# Patient Record
Sex: Female | Born: 1970 | Race: White | Hispanic: No | Marital: Married | Smoking: Former smoker
Health system: Southern US, Community
[De-identification: ages and names within clinical notes are randomized; demographics above are authoritative.]

## PROBLEM LIST (undated history)

## (undated) DIAGNOSIS — I10 Essential (primary) hypertension: Secondary | ICD-10-CM

## (undated) DIAGNOSIS — H547 Unspecified visual loss: Secondary | ICD-10-CM

## (undated) HISTORY — PX: MOUTH SURGERY: SHX715

## (undated) HISTORY — PX: BREAST SURGERY: SHX581

---

## 2001-06-10 ENCOUNTER — Inpatient Hospital Stay (HOSPITAL_COMMUNITY): Admission: AC | Admit: 2001-06-10 | Discharge: 2001-06-11 | Payer: Self-pay | Admitting: *Deleted

## 2005-06-05 ENCOUNTER — Emergency Department (HOSPITAL_COMMUNITY): Admission: EM | Admit: 2005-06-05 | Discharge: 2005-06-05 | Payer: Self-pay | Admitting: Emergency Medicine

## 2006-04-20 ENCOUNTER — Ambulatory Visit: Payer: Self-pay | Admitting: Internal Medicine

## 2006-04-24 ENCOUNTER — Emergency Department (HOSPITAL_COMMUNITY): Admission: EM | Admit: 2006-04-24 | Discharge: 2006-04-24 | Payer: Self-pay | Admitting: Emergency Medicine

## 2006-04-27 ENCOUNTER — Ambulatory Visit: Payer: Self-pay | Admitting: Internal Medicine

## 2006-04-28 ENCOUNTER — Emergency Department (HOSPITAL_COMMUNITY): Admission: EM | Admit: 2006-04-28 | Discharge: 2006-04-28 | Payer: Self-pay | Admitting: Emergency Medicine

## 2006-06-01 ENCOUNTER — Ambulatory Visit: Payer: Self-pay | Admitting: Internal Medicine

## 2006-06-10 DIAGNOSIS — L0292 Furuncle, unspecified: Secondary | ICD-10-CM | POA: Insufficient documentation

## 2006-06-10 DIAGNOSIS — L0293 Carbuncle, unspecified: Secondary | ICD-10-CM

## 2006-06-10 DIAGNOSIS — M279 Disease of jaws, unspecified: Secondary | ICD-10-CM | POA: Insufficient documentation

## 2013-03-03 ENCOUNTER — Emergency Department: Payer: Self-pay | Admitting: Emergency Medicine

## 2013-03-03 LAB — BASIC METABOLIC PANEL
Anion Gap: 4 — ABNORMAL LOW (ref 7–16)
BUN: 10 mg/dL (ref 7–18)
Calcium, Total: 9.1 mg/dL (ref 8.5–10.1)
Chloride: 106 mmol/L (ref 98–107)
Co2: 27 mmol/L (ref 21–32)
Creatinine: 0.8 mg/dL (ref 0.60–1.30)
EGFR (African American): >60
EGFR (Non-African Amer.): >60
Glucose: 117 mg/dL — ABNORMAL HIGH (ref 65–99)
Osmolality: 274 (ref 275–301)
Potassium: 3.7 mmol/L (ref 3.5–5.1)
Sodium: 137 mmol/L (ref 136–145)

## 2013-03-03 LAB — CBC
HCT: 38.6 % (ref 35.0–47.0)
HGB: 13.2 g/dL (ref 12.0–16.0)
MCH: 28.1 pg (ref 26.0–34.0)
MCHC: 34.3 g/dL (ref 32.0–36.0)
MCV: 82 fL (ref 80–100)
Platelet: 290 10*3/uL (ref 150–440)
RBC: 4.71 10*6/uL (ref 3.80–5.20)
RDW: 14.7 % — ABNORMAL HIGH (ref 11.5–14.5)
WBC: 13 10*3/uL — ABNORMAL HIGH (ref 3.6–11.0)

## 2014-08-30 ENCOUNTER — Emergency Department: Payer: Self-pay | Admitting: Student

## 2014-11-02 ENCOUNTER — Emergency Department: Payer: Self-pay | Admitting: Emergency Medicine

## 2014-11-02 LAB — URINALYSIS, COMPLETE
Bacteria: NONE SEEN
Bilirubin,UR: NEGATIVE
Blood: NEGATIVE
Glucose,UR: NEGATIVE mg/dL (ref 0–75)
Ketone: NEGATIVE
Leukocyte Esterase: NEGATIVE
Nitrite: NEGATIVE
Ph: 5 (ref 4.5–8.0)
Protein: NEGATIVE
RBC,UR: <1 /HPF (ref 0–5)
Specific Gravity: 1.024 (ref 1.003–1.030)
Squamous Epithelial: 1
WBC UR: 1 /HPF (ref 0–5)

## 2014-11-02 LAB — COMPREHENSIVE METABOLIC PANEL
Albumin: 4.1 g/dL
Alkaline Phosphatase: 81 U/L
Anion Gap: 6 — ABNORMAL LOW (ref 7–16)
BUN: 10 mg/dL
Bilirubin,Total: 0.6 mg/dL
Calcium, Total: 8.9 mg/dL
Chloride: 105 mmol/L
Co2: 25 mmol/L
Creatinine: 0.75 mg/dL
EGFR (African American): >60
EGFR (Non-African Amer.): >60
Glucose: 164 mg/dL — ABNORMAL HIGH
Potassium: 3.3 mmol/L — ABNORMAL LOW
SGOT(AST): 29 U/L
SGPT (ALT): 39 U/L
Sodium: 136 mmol/L
Total Protein: 7.3 g/dL

## 2014-11-02 LAB — CBC
HCT: 44.4 % (ref 35.0–47.0)
HGB: 15.2 g/dL (ref 12.0–16.0)
MCH: 28.7 pg (ref 26.0–34.0)
MCHC: 34.1 g/dL (ref 32.0–36.0)
MCV: 84 fL (ref 80–100)
Platelet: 354 10*3/uL (ref 150–440)
RBC: 5.29 10*6/uL — ABNORMAL HIGH (ref 3.80–5.20)
RDW: 14.2 % (ref 11.5–14.5)
WBC: 19 10*3/uL — ABNORMAL HIGH (ref 3.6–11.0)

## 2014-11-02 LAB — TROPONIN I: Troponin-I: <0.03 ng/mL

## 2014-11-02 LAB — LIPASE, BLOOD: Lipase: 55 U/L — ABNORMAL HIGH

## 2016-06-11 ENCOUNTER — Encounter (HOSPITAL_COMMUNITY): Payer: Self-pay | Admitting: *Deleted

## 2016-06-11 ENCOUNTER — Emergency Department (HOSPITAL_COMMUNITY)
Admission: EM | Admit: 2016-06-11 | Discharge: 2016-06-12 | Disposition: A | Discharge status: Home / Self Care | Payer: No Typology Code available for payment source | Attending: Emergency Medicine | Admitting: Emergency Medicine

## 2016-06-11 ENCOUNTER — Emergency Department (HOSPITAL_COMMUNITY): Payer: No Typology Code available for payment source

## 2016-06-11 DIAGNOSIS — Y999 Unspecified external cause status: Secondary | ICD-10-CM | POA: Diagnosis not present

## 2016-06-11 DIAGNOSIS — Y939 Activity, unspecified: Secondary | ICD-10-CM | POA: Insufficient documentation

## 2016-06-11 DIAGNOSIS — I10 Essential (primary) hypertension: Secondary | ICD-10-CM | POA: Insufficient documentation

## 2016-06-11 DIAGNOSIS — R51 Headache: Secondary | ICD-10-CM | POA: Insufficient documentation

## 2016-06-11 DIAGNOSIS — F17200 Nicotine dependence, unspecified, uncomplicated: Secondary | ICD-10-CM | POA: Diagnosis not present

## 2016-06-11 DIAGNOSIS — Y92410 Unspecified street and highway as the place of occurrence of the external cause: Secondary | ICD-10-CM | POA: Insufficient documentation

## 2016-06-11 HISTORY — DX: Essential (primary) hypertension: I10

## 2016-06-11 MED ORDER — NAPROXEN 500 MG PO TABS: 500.0000 mg | ORAL_TABLET | Freq: Two times a day (BID) | ORAL | 0 refills | Status: DC

## 2016-06-11 MED ORDER — CYCLOBENZAPRINE HCL 10 MG PO TABS: 10.0000 mg | ORAL_TABLET | Freq: Three times a day (TID) | ORAL | 0 refills | Status: DC | PRN

## 2016-06-11 MED ORDER — KETOROLAC TROMETHAMINE 30 MG/ML IJ SOLN
30.0000 mg | Freq: Once | INTRAMUSCULAR | Status: AC
  Administered 2016-06-11: 30 mg via INTRAMUSCULAR
  Filled 2016-06-11: qty 1

## 2016-06-11 MED ORDER — CYCLOBENZAPRINE HCL 10 MG PO TABS
5.0000 mg | ORAL_TABLET | Freq: Once | ORAL | Status: AC
  Administered 2016-06-11: 5 mg via ORAL
  Filled 2016-06-11: qty 1

## 2016-06-11 MED ORDER — HYDROCODONE-ACETAMINOPHEN 5-325 MG PO TABS
1.0000 | ORAL_TABLET | Freq: Once | ORAL | Status: AC
  Administered 2016-06-11: 1 via ORAL
  Filled 2016-06-11: qty 1
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
## 2016-06-11 NOTE — Discharge Instructions (Signed)
Read the information below.  °Your x-rays were re-assuring.  °You may feel sore for the next 2-3 days. I have prescribed naprosyn and flexeril for relief. While taking naprosyn do not take other NSAIDs (ibuprofen, motrin, or aleve). Flexeril can make you drowsy, do not drive after taking.  °You can apply heat/ice to affected areas for 20 minute increments.  °Warm showers can soothe sore muscles.  °If symptoms persist for more than a week follow up with your primary provider.  °Use the prescribed medication as directed.  Please discuss all new medications with your pharmacist.   °You may return to the Emergency Department at any time for worsening condition or any new symptoms that concern you. ° ° ° °

## 2016-06-11 NOTE — ED Notes (Signed)
Staff went to update the pt's vitals, pt was upset that she has not been seen yet by a provider. Staff explained that when a provider came to her room she wasn't there. Pt has been seen leaving her room to go to the lobby numerous times, pt denies it and said that she only left once an hour ago. Staff let the pt know that a provider has signed up to see her and will be with her shortly, pt still upset.
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
## 2017-11-22 ENCOUNTER — Ambulatory Visit (HOSPITAL_COMMUNITY)
Admission: EM | Admit: 2017-11-22 | Discharge: 2017-11-22 | Disposition: A | Discharge status: Home / Self Care | Payer: Self-pay | Attending: Internal Medicine | Admitting: Internal Medicine

## 2017-11-22 ENCOUNTER — Other Ambulatory Visit: Payer: Self-pay

## 2017-11-22 ENCOUNTER — Encounter (HOSPITAL_COMMUNITY): Payer: Self-pay | Admitting: Emergency Medicine

## 2017-11-22 DIAGNOSIS — M79605 Pain in left leg: Secondary | ICD-10-CM

## 2017-11-22 MED ORDER — NAPROXEN 500 MG PO TABS: 500.0000 mg | ORAL_TABLET | Freq: Two times a day (BID) | ORAL | 0 refills | Status: DC

## 2017-11-22 MED ORDER — CYCLOBENZAPRINE HCL 10 MG PO TABS: 10.0000 mg | ORAL_TABLET | Freq: Two times a day (BID) | ORAL | 0 refills | Status: AC

## 2017-11-22 MED ORDER — TRAMADOL HCL 50 MG PO TABS: 50.0000 mg | ORAL_TABLET | Freq: Four times a day (QID) | ORAL | 0 refills | Status: DC | PRN

## 2017-11-22 MED ORDER — HYDROCODONE-ACETAMINOPHEN 5-325 MG PO TABS: 1.0000 | ORAL_TABLET | Freq: Four times a day (QID) | ORAL | 0 refills | Status: AC | PRN

## 2017-11-22 MED ORDER — CYCLOBENZAPRINE HCL 10 MG PO TABS: 10.0000 mg | ORAL_TABLET | Freq: Three times a day (TID) | ORAL | 0 refills | Status: DC | PRN

## 2017-11-22 NOTE — Discharge Instructions (Signed)
Please apply ice multiple times a day.  Use anti-inflammatories for pain/swelling. You may take up to 800 mg Ibuprofen every 8 hours with food. You may supplement Ibuprofen with Tylenol [phone] mg every 8 hours.  Or you may try Naprosyn.  You may supplement the above with Flexeril, which is a muscle relaxer.  Please only use when at home or at nighttime as this causes sedation.  I have sent in 3 days worth of tramadol for you to use for severe pain.  Please only use at nighttime or when you will not be driving, as this causes sedation.  Please return if you develop left leg swelling, redness or worsening pain.  Please return if symptoms not improving in approximately 1-2 weeks.

## 2017-11-22 NOTE — ED Triage Notes (Signed)
Left lower back pain onset 2 months ago, now pain in back of left thigh and behind knee cap.  No known injury

## 2017-11-23 NOTE — ED Provider Notes (Signed)
[facility]    CSN: [id number] Arrival date & time: 11/22/17  1339     History   Chief Complaint Chief Complaint  Patient presents with  . Leg Pain    HPI Haley Griffith is a 47 y.o. female presenting today with posterior left thigh pain. She states she initially had lower back pain and thought it was sciatica but pain has moved to only be in her left upper leg. Pain has been occurring for 1 week. Has tried Ibuprofen, BC Powder, Tylenol, Bio freeze and TENS unit. She denies any specific injury or increase in activity. Denies numbness or tingling. Denies loss of bowel or bladder control. Worse with bending. Denies any redness or swelling. Patient is a smoker. Denies previous DVT/PE. Denies recent travel or immobilization, denies history of cancer.  HPI  Past Medical History:  Diagnosis Date  . Hypertension     Patient Active Problem List   Diagnosis Date Noted  . JAW PAIN 06/10/2006  . FURUNCLE 06/10/2006    History reviewed. No pertinent surgical history.  OB History   None      Home Medications    Prior to Admission medications   Medication Sig Start Date End Date Taking? Authorizing Provider  Aspirin-Acetaminophen (GOODYS BODY PAIN PO) Take by mouth.   Yes [provider]  ibuprofen (ADVIL,MOTRIN) 200 MG tablet Take 200 mg by mouth every 6 (six) hours as needed.   Yes [provider]  Menthol, Topical Analgesic, (BIOFREEZE EX) Apply topically.   Yes [provider]  Nerve Stimulator (PRO COMFORT TENS UNIT) DEVI by Does not apply route.   Yes [provider]  cyclobenzaprine (FLEXERIL) 10 MG tablet Take 1 tablet (10 mg total) by mouth 2 (two) times daily for 7 days. 11/22/17 11/29/17  Wieters, Hallie C, PA-C  HYDROcodone-acetaminophen (NORCO/VICODIN) 5-325 MG tablet Take 1 tablet by mouth every 6 (six) hours as needed for up to 3 days for severe pain. 11/22/17 11/25/17  Wieters, Hallie C, PA-C  naproxen (NAPROSYN) 500  MG tablet Take 1 tablet (500 mg total) by mouth 2 (two) times daily. 11/22/17   Wieters, Junius CreamerHallie C, PA-C    Family History Family History  Problem Relation Age of Onset  . Cancer Mother   . Cancer Father     Social History Social History   Tobacco Use  . Smoking status: Current Every Day Smoker  . Smokeless tobacco: Never Used  Substance Use Topics  . Alcohol use: Yes  . Drug use: Never     Allergies   Patient has no known allergies.   Review of Systems Review of Systems  Constitutional: Negative for fatigue and fever.  Respiratory: Negative for cough and shortness of breath.   Cardiovascular: Negative for chest pain.  Gastrointestinal: Negative for abdominal pain, nausea and vomiting.  Genitourinary: Negative for decreased urine volume and difficulty urinating.  Musculoskeletal: Positive for myalgias. Negative for arthralgias, back pain, gait problem and joint swelling.  Skin: Negative for color change, pallor and wound.  Neurological: Negative for dizziness, weakness, light-headedness and numbness.     Physical Exam Triage Vital Signs ED Triage Vitals  Enc Vitals Group     BP 11/22/17 1415 (!) 159/95     Pulse Rate 11/22/17 1415 90     Resp 11/22/17 1415 18     Temp 11/22/17 1415 97.9 F (36.6 C)     Temp Source 11/22/17 1415 Oral     SpO2 11/22/17 1415 96 %  Weight --      Height --      Head Circumference --      Peak Flow --      Pain Score 11/22/17 1411 10     Pain Loc --      Pain Edu? --      Excl. in GC? --    No data found.  Updated Vital Signs BP (!) 159/95 (BP Location: Left Arm)   Pulse 90   Temp 97.9 F (36.6 C) (Oral)   Resp 18   LMP 11/12/2017   SpO2 96%   Visual Acuity Right Eye Distance:   Left Eye Distance:   Bilateral Distance:    Right Eye Near:   Left Eye Near:    Bilateral Near:     Physical Exam  Constitutional: She appears well-developed and well-nourished. No distress.  HENT:  Head: Normocephalic and  atraumatic.  Eyes: Conjunctivae are normal.  Neck: Neck supple.  Cardiovascular: Normal rate and regular rhythm.  No murmur heard. Pulmonary/Chest: Effort normal and breath sounds normal. No respiratory distress.  Musculoskeletal: She exhibits no edema.  Left leg- no obvious swelling, or deformity, no overlying erythema to thigh or calf.MIld tenderness to palpation of posterior and lateral hamstring. No calf swelling or tenderness.   No obvious gait abnormality.  Neurological: She is alert.  Skin: Skin is warm and dry. Capillary refill takes less than 2 seconds.  Psychiatric: She has a normal mood and affect.  Nursing note and vitals reviewed.    UC Treatments / Results  Labs (all labs ordered are listed, but only abnormal results are displayed) Labs Reviewed - No data to display  EKG None Radiology No results found.  Procedures Procedures (including critical care time)  Medications Ordered in UC Medications - No data to display   Initial Impression / Assessment and Plan / UC Course  I have reviewed the triage vital signs and the nursing notes.  Pertinent labs & imaging results that were available during my care of the patient were reviewed by me and considered in my medical decision making (see chart for details).     Patient likely with strained hamstring vs bursitis. reccomending conservative treatment. Recommending NSAIDS during the day, may use hydrocodone at night. Initially provided tramadol- patient stated that tramadol does not work with her. Had discussion that we were treating a different pain than what she previously took tramadol before. Patient expressing financial concerns about paying for tramadol and it not working. Switched to hydrocodone. Exam not concerning for PE, but patient is smoker. Discussed signs and symptoms of clot to return. Discussed strict return precautions. Patient verbalized understanding and is agreeable with plan.   Final Clinical  Impressions(s) / UC Diagnoses   Final diagnoses:  Left leg pain    ED Discharge Orders        Ordered    cyclobenzaprine (FLEXERIL) 10 MG tablet  3 times daily PRN,   Status:  Discontinued     11/22/17 1525    naproxen (NAPROSYN) 500 MG tablet  2 times daily,   Status:  Discontinued     11/22/17 1525    traMADol (ULTRAM) 50 MG tablet  Every 6 hours PRN,   Status:  Discontinued     11/22/17 1525    cyclobenzaprine (FLEXERIL) 10 MG tablet  2 times daily     11/22/17 1529    naproxen (NAPROSYN) 500 MG tablet  2 times daily     11/22/17 1529  HYDROcodone-acetaminophen (NORCO/VICODIN) 5-325 MG tablet  Every 6 hours PRN    Note to Pharmacy:  Please d/c tramadol rx previously sent   11/22/17 1538       Controlled Substance Prescriptions [place] Controlled Substance Registry consulted? Yes, I have consulted the [place] Controlled Substances Registry for this patient, and feel the risk/benefit ratio today is favorable for proceeding with this prescription for a controlled substance.   Lew Dawes, PA-C 11/23/17 1014

## 2018-09-22 ENCOUNTER — Ambulatory Visit (HOSPITAL_COMMUNITY)
Admission: EM | Admit: 2018-09-22 | Discharge: 2018-09-22 | Disposition: A | Discharge status: Home / Self Care | Payer: Self-pay | Attending: Family Medicine | Admitting: Family Medicine

## 2018-09-22 ENCOUNTER — Encounter (HOSPITAL_COMMUNITY): Payer: Self-pay | Admitting: Family Medicine

## 2018-09-22 DIAGNOSIS — G5602 Carpal tunnel syndrome, left upper limb: Secondary | ICD-10-CM

## 2018-09-22 MED ORDER — IBUPROFEN 800 MG PO TABS
800.0000 mg | ORAL_TABLET | Freq: Once | ORAL | Status: AC
  Administered 2018-09-22: 800 mg via ORAL

## 2018-09-22 MED ORDER — IBUPROFEN 800 MG PO TABS
ORAL_TABLET | ORAL | Status: AC
  Filled 2018-09-22: qty 1

## 2018-09-22 MED ORDER — PREDNISONE 20 MG PO TABS: ORAL_TABLET | ORAL | 0 refills | Status: DC

## 2018-09-22 NOTE — ED Provider Notes (Signed)
[facility]    CSN: [id number] Arrival date & time: 09/22/18  1726     History   Chief Complaint Chief Complaint  Patient presents with  . Hand Pain    HPI Haley Griffith is a 48 y.o. female.   48 year old established patient at [facility] urgent care on [address] who complains of hand pain.  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  She has had a history of carpal tunnel in the right hand and had surgery for.  Now she has had several days of progressive pain in the left hand with numbness both volar and dorsal.  She has no neck pain.  She is had no injury.  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  The room is quite cold as well.  Patient is on her feet for approximately 8 hours every day.  Patient has used splints before and thinks it may help.  She thinks she can get gloves over the splint.  She repeatedly asked for pain medicine but says it she cannot take anything that will make her drowsy at work.  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     Past Medical History:  Diagnosis Date  . Hypertension     Patient Active Problem List   Diagnosis Date Noted  . JAW PAIN 06/10/2006  . FURUNCLE 06/10/2006    History reviewed. No pertinent surgical history.  OB History   No obstetric history on file.      Home Medications    Prior to Admission medications   Medication Sig Start Date End Date Taking? Authorizing Provider  Nerve Stimulator (PRO COMFORT TENS UNIT) DEVI by Does not apply route.    [provider]  predniSONE (DELTASONE) 20 MG tablet Two daily with food 09/22/18   Elvina Sidle, MD    Family History Family History  Problem Relation Age of Onset  . Cancer Mother   . Cancer Father     Social History Social History   Tobacco Use  . Smoking status: Current  Every Day Smoker  . Smokeless tobacco: Never Used  Substance Use Topics  . Alcohol use: Yes  . Drug use: Never     Allergies   Patient has no known allergies.   Review of Systems Review of Systems   Physical Exam Triage Vital Signs ED Triage Vitals  Enc Vitals Group     BP      Pulse      Resp      Temp      Temp src      SpO2      Weight      Height      Head Circumference      Peak Flow      Pain Score      Pain Loc      Pain Edu?      Excl. in GC?    No data found.  Updated Vital Signs BP (!) 159/72 (BP Location: Left Arm)   Pulse 80   Temp 98 F (36.7 C)   Resp 16   LMP 09/22/2018   SpO2 99%    Physical Exam Vitals signs and nursing note reviewed.  Constitutional:      Appearance: Normal appearance.  Pulmonary:  Effort: Pulmonary effort is normal.  Musculoskeletal: Normal range of motion.        General: Swelling present. No tenderness, deformity or signs of injury.     Comments: Patient has discomfort with hyperextension of the left wrist.  There is no Tinel's sign or Finkelstein's test positive.  There is some fullness in the left volar wrist.  Skin:    General: Skin is warm and dry.  Neurological:     General: No focal deficit present.     Mental Status: She is alert.  Psychiatric:        Mood and Affect: Mood normal.      UC Treatments / Results  Labs (all labs ordered are listed, but only abnormal results are displayed) Labs Reviewed - No data to display  EKG None  Radiology No results found.  Procedures Procedures (including critical care time)  Medications Ordered in UC Medications  ibuprofen (ADVIL,MOTRIN) tablet 800 mg (has no administration in time range)    Initial Impression / Assessment and Plan / UC Course  I have reviewed the triage vital signs and the nursing notes.  Pertinent labs & imaging results that were available during my care of the patient were reviewed by me and considered in my medical  decision making (see chart for details).    Final Clinical Impressions(s) / UC Diagnoses   Final diagnoses:  Carpal tunnel syndrome of left wrist   Discharge Instructions   None    ED Prescriptions    Medication Sig Dispense Auth. Provider   predniSONE (DELTASONE) 20 MG tablet Two daily with food 10 tablet Elvina SidleLauenstein, Shealynn Saulnier, MD     Controlled Substance Prescriptions [place] Controlled Substance Registry consulted? Not Applicable   Elvina SidleLauenstein, [place], MD 09/22/18 [id number]

## 2018-09-22 NOTE — ED Triage Notes (Signed)
Pt present pain in both of her hands.  Symptoms started in December. Pt has tried OTC medication with no relief

## 2018-09-29 ENCOUNTER — Ambulatory Visit (HOSPITAL_COMMUNITY)
Admission: EM | Admit: 2018-09-29 | Discharge: 2018-09-29 | Disposition: A | Discharge status: Home / Self Care | Payer: Self-pay

## 2018-09-29 ENCOUNTER — Telehealth (HOSPITAL_COMMUNITY): Payer: Self-pay

## 2018-09-29 NOTE — Telephone Encounter (Signed)
Pt presented to the Urgent Care with request for paperwork to be filled out regarding her job. Patient informed we cannot fill out paperwork. Requesting work note with no restrictions. Dr. Lamptey spoke with the patient and agreed to give her a work note with no restrictions.  

## 2019-04-30 ENCOUNTER — Ambulatory Visit (HOSPITAL_COMMUNITY)
Admission: EM | Admit: 2019-04-30 | Discharge: 2019-04-30 | Disposition: A | Discharge status: Home / Self Care | Payer: Self-pay | Attending: Family Medicine | Admitting: Family Medicine

## 2019-04-30 ENCOUNTER — Encounter (HOSPITAL_COMMUNITY): Payer: Self-pay | Admitting: Family Medicine

## 2019-04-30 ENCOUNTER — Other Ambulatory Visit: Payer: Self-pay

## 2019-04-30 DIAGNOSIS — K047 Periapical abscess without sinus: Secondary | ICD-10-CM

## 2019-04-30 DIAGNOSIS — L02419 Cutaneous abscess of limb, unspecified: Secondary | ICD-10-CM

## 2019-04-30 MED ORDER — AMOXICILLIN-POT CLAVULANATE 875-125 MG PO TABS: 1.0000 | ORAL_TABLET | Freq: Two times a day (BID) | ORAL | 0 refills | Status: DC

## 2019-04-30 MED ORDER — HYDROCODONE-ACETAMINOPHEN 5-325 MG PO TABS: 1.0000 | ORAL_TABLET | Freq: Four times a day (QID) | ORAL | 0 refills | Status: DC | PRN

## 2019-04-30 NOTE — ED Provider Notes (Signed)
Haley Griffith    CSN: [id number] Arrival date & time: 04/30/19  1341      History   Chief Complaint Chief Complaint  Patient presents with  . Dental Pain    HPI Haley Griffith is a 48 y.o. female.   This is a 48 year old established [facility] patient who comes in complaining of dental pain off and on for a week, but worse over night.  Pain is gradually migrating along the left upper gum toward the incisors.  Also c/o abscess (recurrent) under right arm over past several days.  No fever, N, V  Patient is unemployed currently.       Past Medical History:  Diagnosis Date  . Hypertension     Patient Active Problem List   Diagnosis Date Noted  . JAW PAIN 06/10/2006  . FURUNCLE 06/10/2006    History reviewed. No pertinent surgical history.  OB History   No obstetric history on file.      Home Medications    Prior to Admission medications   Medication Sig Start Date End Date Taking? Authorizing Provider  amoxicillin-clavulanate (AUGMENTIN) 875-125 MG tablet Take 1 tablet by mouth every 12 (twelve) hours. 04/30/19   Robyn Haber, MD  HYDROcodone-acetaminophen (NORCO) 5-325 MG tablet Take 1 tablet by mouth every 6 (six) hours as needed for moderate pain. 04/30/19   Robyn Haber, MD  Nerve Stimulator (PRO COMFORT TENS UNIT) DEVI by Does not apply route.    [provider]    Family History Family History  Problem Relation Age of Onset  . Cancer Mother   . Cancer Father     Social History Social History   Tobacco Use  . Smoking status: Current Every Day Smoker  . Smokeless tobacco: Never Used  Substance Use Topics  . Alcohol use: Yes  . Drug use: Never     Allergies   Patient has no known allergies.   Review of Systems Review of Systems  Constitutional: Negative.   HENT: Positive for dental problem.   Gastrointestinal: Negative.   Skin: Positive for color change.  All other systems reviewed and  are negative.    Physical Exam Triage Vital Signs ED Triage Vitals  Enc Vitals Group     BP      Pulse      Resp      Temp      Temp src      SpO2      Weight      Height      Head Circumference      Peak Flow      Pain Score      Pain Loc      Pain Edu?      Excl. in [place]?    No data found.  Updated Vital Signs BP (!) 152/87 (BP Location: Right Arm)   Pulse 78   Temp 98.2 F (36.8 C) (Temporal)   Resp 18   SpO2 95%    Physical Exam Vitals signs and nursing note reviewed.  Constitutional:      General: She is not in acute distress.    Appearance: Normal appearance. She is not ill-appearing.  HENT:     Head: Normocephalic.     Mouth/Throat:     Comments: Missing teeth #13-16 with some gingival swelling were #13 used to be Eyes:     Conjunctiva/sclera: Conjunctivae normal.  Neck:     Musculoskeletal: Normal range of motion and neck  supple.  Musculoskeletal: Normal range of motion.  Lymphadenopathy:     Cervical: No cervical adenopathy.  Skin:    General: Skin is warm and dry.     Findings: Lesion present.     Comments: 1 to 2 cm right axillary abscess with erythema and mild swelling  Neurological:     General: No focal deficit present.     Mental Status: She is alert and oriented to person, place, and time.  Psychiatric:        Mood and Affect: Mood normal.        Thought Content: Thought content normal.      UC Treatments / Results  Labs (all labs ordered are listed, but only abnormal results are displayed) Labs Reviewed - No data to display  EKG   Radiology No results found.  Procedures Procedures (including critical care time)  Medications Ordered in UC Medications - No data to display  Initial Impression / Assessment and Plan / UC Course  I have reviewed the triage vital signs and the nursing notes.  Pertinent labs & imaging results that were available during my care of the patient were reviewed by me and considered in my medical  decision making (see chart for details).    Final Clinical Impressions(s) / UC Diagnoses   Final diagnoses:  Dental abscess  Axillary abscess     Discharge Instructions     Please try to find a dentist to help you     ED Prescriptions    Medication Sig Dispense Auth. Provider   amoxicillin-clavulanate (AUGMENTIN) 875-125 MG tablet Take 1 tablet by mouth every 12 (twelve) hours. 14 tablet Elvina SidleLauenstein, Omauri Boeve, MD   HYDROcodone-acetaminophen (NORCO) 5-325 MG tablet Take 1 tablet by mouth every 6 (six) hours as needed for moderate pain. 12 tablet Elvina SidleLauenstein, Lois Slagel, MD     I have reviewed the PDMP during this encounter.   Elvina SidleLauenstein, Charlyn Vialpando, MD 04/30/19 1436

## 2019-04-30 NOTE — ED Triage Notes (Signed)
Provider triage  

## 2019-04-30 NOTE — Discharge Instructions (Addendum)
Please try to find a dentist to help you

## 2020-06-25 ENCOUNTER — Encounter (HOSPITAL_BASED_OUTPATIENT_CLINIC_OR_DEPARTMENT_OTHER): Payer: Self-pay | Admitting: Emergency Medicine

## 2020-06-25 ENCOUNTER — Other Ambulatory Visit: Payer: Self-pay

## 2020-06-25 ENCOUNTER — Emergency Department (HOSPITAL_BASED_OUTPATIENT_CLINIC_OR_DEPARTMENT_OTHER)
Admission: EM | Admit: 2020-06-25 | Discharge: 2020-06-25 | Disposition: A | Discharge status: Home / Self Care | Payer: Medicaid Other | Attending: Emergency Medicine | Admitting: Emergency Medicine

## 2020-06-25 DIAGNOSIS — H5712 Ocular pain, left eye: Secondary | ICD-10-CM | POA: Diagnosis not present

## 2020-06-25 DIAGNOSIS — H538 Other visual disturbances: Secondary | ICD-10-CM | POA: Diagnosis not present

## 2020-06-25 DIAGNOSIS — R519 Headache, unspecified: Secondary | ICD-10-CM | POA: Insufficient documentation

## 2020-06-25 DIAGNOSIS — F17210 Nicotine dependence, cigarettes, uncomplicated: Secondary | ICD-10-CM | POA: Insufficient documentation

## 2020-06-25 DIAGNOSIS — I10 Essential (primary) hypertension: Secondary | ICD-10-CM | POA: Insufficient documentation

## 2020-06-25 MED ORDER — TETRACAINE HCL 0.5 % OP SOLN: 1.0000 [drp] | Freq: Once | OPHTHALMIC | Status: AC

## 2020-06-25 MED ORDER — TIMOLOL MALEATE 0.5 % OP SOLN
1.0000 [drp] | Freq: Two times a day (BID) | OPHTHALMIC | Status: DC
  Administered 2020-06-25: 1 [drp] via OPHTHALMIC
  Filled 2020-06-25: qty 5

## 2020-06-25 MED ORDER — HYDROMORPHONE HCL 1 MG/ML IJ SOLN
0.5000 mg | Freq: Once | INTRAMUSCULAR | Status: AC
  Administered 2020-06-25: 0.5 mg via INTRAMUSCULAR
  Filled 2020-06-25: qty 1

## 2020-06-25 MED ORDER — TETRACAINE HCL 0.5 % OP SOLN
OPHTHALMIC | Status: AC
  Administered 2020-06-25: 1 [drp] via OPHTHALMIC
  Filled 2020-06-25: qty 4

## 2020-06-25 MED ORDER — TIMOLOL MALEATE 0.5 % OP SOLN: 1.0000 [drp] | Freq: Two times a day (BID) | OPHTHALMIC | 0 refills | Status: AC

## 2020-06-25 MED ORDER — FLUORESCEIN SODIUM 1 MG OP STRP: 1.0000 | ORAL_STRIP | Freq: Once | OPHTHALMIC | Status: AC

## 2020-06-25 MED ORDER — FLUORESCEIN SODIUM 1 MG OP STRP
ORAL_STRIP | OPHTHALMIC | Status: AC
  Administered 2020-06-25: 1 via OPHTHALMIC
  Filled 2020-06-25: qty 1
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
## 2020-06-25 NOTE — ED Notes (Signed)
Discharge instructions discussed with patient. Medication and follow up care importance verbalized and reiterated back. Departs ED at this time in stable condition.

## 2020-06-25 NOTE — ED Notes (Signed)
Called [phone] for consult spoke to [facility]

## 2020-07-08 ENCOUNTER — Emergency Department (HOSPITAL_COMMUNITY)
Admission: EM | Admit: 2020-07-08 | Discharge: 2020-07-09 | Disposition: A | Discharge status: Home / Self Care | Payer: HRSA Program | Attending: Emergency Medicine | Admitting: Emergency Medicine

## 2020-07-08 ENCOUNTER — Other Ambulatory Visit: Payer: Self-pay

## 2020-07-08 ENCOUNTER — Emergency Department (HOSPITAL_COMMUNITY): Payer: HRSA Program

## 2020-07-08 ENCOUNTER — Encounter (HOSPITAL_COMMUNITY): Payer: Self-pay | Admitting: Emergency Medicine

## 2020-07-08 DIAGNOSIS — H209 Unspecified iridocyclitis: Secondary | ICD-10-CM | POA: Insufficient documentation

## 2020-07-08 DIAGNOSIS — R519 Headache, unspecified: Secondary | ICD-10-CM | POA: Diagnosis present

## 2020-07-08 DIAGNOSIS — W228XXA Striking against or struck by other objects, initial encounter: Secondary | ICD-10-CM | POA: Insufficient documentation

## 2020-07-08 DIAGNOSIS — R21 Rash and other nonspecific skin eruption: Secondary | ICD-10-CM | POA: Insufficient documentation

## 2020-07-08 DIAGNOSIS — H547 Unspecified visual loss: Secondary | ICD-10-CM | POA: Insufficient documentation

## 2020-07-08 DIAGNOSIS — I10 Essential (primary) hypertension: Secondary | ICD-10-CM | POA: Diagnosis not present

## 2020-07-08 DIAGNOSIS — Z5900 Homelessness unspecified: Secondary | ICD-10-CM | POA: Insufficient documentation

## 2020-07-08 DIAGNOSIS — U071 COVID-19: Secondary | ICD-10-CM | POA: Diagnosis not present

## 2020-07-08 DIAGNOSIS — H44003 Unspecified purulent endophthalmitis, bilateral: Secondary | ICD-10-CM

## 2020-07-08 LAB — CBC WITH DIFFERENTIAL/PLATELET
Abs Immature Granulocytes: 0.04 10*3/uL (ref 0.00–0.07)
Basophils Absolute: 0.1 10*3/uL (ref 0.0–0.1)
Basophils Relative: 1 %
Eosinophils Absolute: 0.1 10*3/uL (ref 0.0–0.5)
Eosinophils Relative: 1 %
HCT: 45 % (ref 36.0–46.0)
Hemoglobin: 14 g/dL (ref 12.0–15.0)
Immature Granulocytes: 0 %
Lymphocytes Relative: 16 %
Lymphs Abs: 1.9 10*3/uL (ref 0.7–4.0)
MCH: 23.2 pg — ABNORMAL LOW (ref 26.0–34.0)
MCHC: 31.1 g/dL (ref 30.0–36.0)
MCV: 74.5 fL — ABNORMAL LOW (ref 80.0–100.0)
Monocytes Absolute: 0.5 10*3/uL (ref 0.1–1.0)
Monocytes Relative: 4 %
Neutro Abs: 9.5 10*3/uL — ABNORMAL HIGH (ref 1.7–7.7)
Neutrophils Relative %: 78 %
Platelets: 524 10*3/uL — ABNORMAL HIGH (ref 150–400)
RBC: 6.04 MIL/uL — ABNORMAL HIGH (ref 3.87–5.11)
RDW: 16.5 % — ABNORMAL HIGH (ref 11.5–15.5)
WBC: 12.1 10*3/uL — ABNORMAL HIGH (ref 4.0–10.5)
nRBC: 0 % (ref 0.0–0.2)

## 2020-07-08 LAB — COMPREHENSIVE METABOLIC PANEL
ALT: 13 U/L (ref 0–44)
AST: 12 U/L — ABNORMAL LOW (ref 15–41)
Albumin: 3.7 g/dL (ref 3.5–5.0)
Alkaline Phosphatase: 73 U/L (ref 38–126)
Anion gap: 13 (ref 5–15)
BUN: 8 mg/dL (ref 6–20)
CO2: 25 mmol/L (ref 22–32)
Calcium: 9.1 mg/dL (ref 8.9–10.3)
Chloride: 98 mmol/L (ref 98–111)
Creatinine, Ser: 0.79 mg/dL (ref 0.44–1.00)
GFR, Estimated: >60 mL/min (ref >60)
Glucose, Bld: 160 mg/dL — ABNORMAL HIGH (ref 70–99)
Potassium: 3.2 mmol/L — ABNORMAL LOW (ref 3.5–5.1)
Sodium: 136 mmol/L (ref 135–145)
Total Bilirubin: 0.6 mg/dL (ref 0.3–1.2)
Total Protein: 9.2 g/dL — ABNORMAL HIGH (ref 6.5–8.1)

## 2020-07-08 LAB — C-REACTIVE PROTEIN: CRP: 2.2 mg/dL — ABNORMAL HIGH (ref <1.0)

## 2020-07-08 LAB — URINALYSIS, ROUTINE W REFLEX MICROSCOPIC
Bilirubin Urine: NEGATIVE
Glucose, UA: NEGATIVE mg/dL
Hgb urine dipstick: NEGATIVE
Ketones, ur: 5 mg/dL — AB
Leukocytes,Ua: NEGATIVE
Nitrite: NEGATIVE
Protein, ur: NEGATIVE mg/dL
Specific Gravity, Urine: 1.011 (ref 1.005–1.030)
pH: 6 (ref 5.0–8.0)

## 2020-07-08 LAB — RAPID URINE DRUG SCREEN, HOSP PERFORMED
Amphetamines: NOT DETECTED
Barbiturates: NOT DETECTED
Benzodiazepines: NOT DETECTED
Cocaine: POSITIVE — AB
Opiates: POSITIVE — AB
Tetrahydrocannabinol: POSITIVE — AB

## 2020-07-08 LAB — FERRITIN: Ferritin: 55 ng/mL (ref 11–307)

## 2020-07-08 LAB — I-STAT BETA HCG BLOOD, ED (MC, WL, AP ONLY): I-stat hCG, quantitative: <5 m[IU]/mL (ref <5)

## 2020-07-08 LAB — RESP PANEL BY RT-PCR (FLU A&B, COVID) ARPGX2
Influenza A by PCR: NEGATIVE
Influenza B by PCR: NEGATIVE
SARS Coronavirus 2 by RT PCR: POSITIVE — AB

## 2020-07-08 LAB — ETHANOL: Alcohol, Ethyl (B): <10 mg/dL (ref <10)

## 2020-07-08 LAB — PROCALCITONIN: Procalcitonin: <0.1 ng/mL

## 2020-07-08 LAB — TRIGLYCERIDES: Triglycerides: 229 mg/dL — ABNORMAL HIGH (ref <150)

## 2020-07-08 LAB — LACTIC ACID, PLASMA
Lactic Acid, Venous: 1.3 mmol/L (ref 0.5–1.9)
Lactic Acid, Venous: 1 mmol/L (ref 0.5–1.9)

## 2020-07-08 LAB — PROTIME-INR
INR: 1.1 (ref 0.8–1.2)
Prothrombin Time: 13.6 seconds (ref 11.4–15.2)

## 2020-07-08 LAB — LIPASE, BLOOD: Lipase: 24 U/L (ref 11–51)

## 2020-07-08 LAB — HIV ANTIBODY (ROUTINE TESTING W REFLEX): HIV Screen 4th Generation wRfx: NONREACTIVE

## 2020-07-08 LAB — SEDIMENTATION RATE: Sed Rate: 68 mm/hr — ABNORMAL HIGH (ref 0–22)

## 2020-07-08 LAB — LACTATE DEHYDROGENASE: LDH: 109 U/L (ref 98–192)

## 2020-07-08 LAB — D-DIMER, QUANTITATIVE: D-Dimer, Quant: 0.62 ug/mL-FEU — ABNORMAL HIGH (ref 0.00–0.50)

## 2020-07-08 LAB — FIBRINOGEN: Fibrinogen: 571 mg/dL — ABNORMAL HIGH (ref 210–475)

## 2020-07-08 MED ORDER — AMLODIPINE BESYLATE 5 MG PO TABS: 10.0000 mg | ORAL_TABLET | Freq: Every day | ORAL | Status: DC

## 2020-07-08 MED ORDER — BRIMONIDINE TARTRATE 0.15 % OP SOLN
1.0000 [drp] | Freq: Two times a day (BID) | OPHTHALMIC | Status: DC
  Administered 2020-07-08 – 2020-07-09 (×2): 1 [drp] via OPHTHALMIC
  Filled 2020-07-08: qty 5

## 2020-07-08 MED ORDER — HYDROMORPHONE HCL 1 MG/ML IJ SOLN
1.0000 mg | Freq: Once | INTRAMUSCULAR | Status: AC
  Administered 2020-07-08: 1 mg via INTRAVENOUS
  Filled 2020-07-08: qty 1

## 2020-07-08 MED ORDER — TIMOLOL MALEATE 0.5 % OP SOLN
1.0000 [drp] | Freq: Two times a day (BID) | OPHTHALMIC | Status: DC
  Administered 2020-07-08: 1 [drp] via OPHTHALMIC
  Filled 2020-07-08: qty 5

## 2020-07-08 MED ORDER — ONDANSETRON HCL 4 MG/2ML IJ SOLN: 4.0000 mg | INTRAMUSCULAR | Status: DC | PRN

## 2020-07-08 MED ORDER — PENICILLIN G BENZATHINE 1200000 UNIT/2ML IM SUSP
2.4000 10*6.[IU] | Freq: Once | INTRAMUSCULAR | Status: AC
  Administered 2020-07-08: 2.4 10*6.[IU] via INTRAMUSCULAR
  Filled 2020-07-08: qty 4

## 2020-07-08 MED ORDER — HYDROMORPHONE HCL 1 MG/ML IJ SOLN
1.0000 mg | INTRAMUSCULAR | Status: DC | PRN
  Administered 2020-07-08 – 2020-07-09 (×4): 1 mg via INTRAVENOUS
  Filled 2020-07-08 (×4): qty 1

## 2020-07-08 MED ORDER — TETRACAINE HCL 0.5 % OP SOLN
2.0000 [drp] | Freq: Once | OPHTHALMIC | Status: AC
  Administered 2020-07-08: 2 [drp] via OPHTHALMIC
  Filled 2020-07-08: qty 4

## 2020-07-08 MED ORDER — LACTATED RINGERS IV BOLUS
1000.0000 mL | Freq: Once | INTRAVENOUS | Status: AC
  Administered 2020-07-08: 1000 mL via INTRAVENOUS

## 2020-07-08 MED ORDER — SODIUM CHLORIDE 0.9 % IV SOLN
1.0000 g | Freq: Once | INTRAVENOUS | Status: AC
  Administered 2020-07-08: 1 g via INTRAVENOUS
  Filled 2020-07-08: qty 1

## 2020-07-08 MED ORDER — FLUORESCEIN SODIUM 1 MG OP STRP
1.0000 | ORAL_STRIP | Freq: Once | OPHTHALMIC | Status: AC
  Administered 2020-07-08: 1 via OPHTHALMIC
  Filled 2020-07-08: qty 1

## 2020-07-08 MED ORDER — DORZOLAMIDE HCL 2 % OP SOLN
1.0000 [drp] | Freq: Three times a day (TID) | OPHTHALMIC | Status: DC
  Administered 2020-07-08: 1 [drp] via OPHTHALMIC
  Filled 2020-07-08: qty 10

## 2020-07-08 MED ORDER — VANCOMYCIN HCL IN DEXTROSE 1-5 GM/200ML-% IV SOLN
1000.0000 mg | Freq: Once | INTRAVENOUS | Status: AC
  Administered 2020-07-08: 1000 mg via INTRAVENOUS
  Filled 2020-07-08: qty 200

## 2020-07-08 MED ORDER — ACETAMINOPHEN 325 MG PO TABS: 650.0000 mg | ORAL_TABLET | ORAL | Status: DC | PRN

## 2020-07-08 MED ORDER — METRONIDAZOLE IN NACL 5-0.79 MG/ML-% IV SOLN
500.0000 mg | Freq: Once | INTRAVENOUS | Status: AC
  Administered 2020-07-08: 500 mg via INTRAVENOUS
  Filled 2020-07-08: qty 100
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
## 2020-07-08 NOTE — Consult Note (Signed)
CC:  Chief Complaint  Patient presents with  . Loss of Vision    HPI: Haley Griffith is a 49 y.o. female w/o previous POH who presents for loss of vision OU. Pt reports beginning of the month falling and hitting head and then vision gradually going out. Pt reports + cough but not sure if any exposure to TB. Denies IV drug use. Unsure of when rash on hands, legs, and face started. Reports not able to see anything in the left eye.   ROS: See above  PMH: Past Medical History:  Diagnosis Date  . Hypertension     PSH: Past Surgical History:  Procedure Laterality Date  . BREAST SURGERY    . MOUTH SURGERY      Meds: No current facility-administered medications on file prior to encounter.   Current Outpatient Medications on File Prior to Encounter  Medication Sig Dispense Refill  . ibuprofen (ADVIL) 200 MG tablet Take 800 mg by mouth every 6 (six) hours as needed for moderate pain.    Marland Kitchen timolol (TIMOPTIC) 0.5 % ophthalmic solution Place 1 drop into the left eye every 12 (twelve) hours. (Patient taking differently: Place 1 drop into both eyes every 12 (twelve) hours. ) 10 mL 0  . amoxicillin-clavulanate (AUGMENTIN) 875-125 MG tablet Take 1 tablet by mouth every 12 (twelve) hours. (Patient taking differently: Take 1 tablet by mouth in the morning, at noon, and at bedtime. ) 14 tablet 0  . HYDROcodone-acetaminophen (NORCO) 5-325 MG tablet Take 1 tablet by mouth every 6 (six) hours as needed for moderate pain. (Patient not taking: Reported on 07/08/2020) 12 tablet 0  . Nerve Stimulator (PRO COMFORT TENS UNIT) DEVI by Does not apply route.      SH: Social History   Socioeconomic History  . Marital status: Single    Spouse name: Not on file  . Number of children: Not on file  . Years of education: Not on file  . Highest education level: Not on file  Occupational History  . Not on file  Tobacco Use  . Smoking status: Current Every Day Smoker    Packs/day: 0.50    Types:  Cigarettes  . Smokeless tobacco: Never Used  Vaping Use  . Vaping Use: Never used  Substance and Sexual Activity  . Alcohol use: Yes    Comment: occ  . Drug use: Never  . Sexual activity: Not on file  Other Topics Concern  . Not on file  Social History Narrative  . Not on file   Social Determinants of Health   Financial Resource Strain:   . Difficulty of Paying Living Expenses: Not on file  Food Insecurity:   . Worried About Programme researcher, broadcasting/film/video in the Last Year: Not on file  . Ran Out of Food in the Last Year: Not on file  Transportation Needs:   . Lack of Transportation (Medical): Not on file  . Lack of Transportation (Non-Medical): Not on file  Physical Activity:   . Days of Exercise per Week: Not on file  . Minutes of Exercise per Session: Not on file  Stress:   . Feeling of Stress : Not on file  Social Connections:   . Frequency of Communication with Friends and Family: Not on file  . Frequency of Social Gatherings with Friends and Family: Not on file  . Attends Religious Services: Not on file  . Active Member of Clubs or Organizations: Not on file  . Attends Banker  Meetings: Not on file  . Marital Status: Not on file    FH: Family History  Problem Relation Age of Onset  . Cancer Mother   . Cancer Father     Exam:  Zenaida Niece: OD: LP OS: NLP  CVF: OD: LP OS: NLP  EOM: OD: full d/v OS: full d/v  Pupils: OD: 2 mm - fixed OS: 2 mm - fixed  IOP: by [place] OD: 45 OS: would not read - suspect >50  External: Facial rash OD: no significant edema OS: no significant edema    Pen Light Exam: L/L: OD: no significant edema OS: no significant edema  C/S: OD: 3+ injection, tr chemosis OS: 3+ injection, tr chemosis  K: OD: 2+ stromal granularity w/ mild edema; diffuse KP OS: 2+ stromal granularity w/ mild edema; diffuse KP  A/C: OD: deep w/ formed fibrin in the AC OS: deep w/ formed fibrin in the [facility]  I: OD: PS, fixed, miotic OS:  PS, fixed, miotic; ? 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 Sherryll Burger, MD [facility] [phone]

## 2020-07-08 NOTE — ED Notes (Signed)
Date and time results received: 07/08/20 7:14 PM  (use smartphrase ".now" to insert current time)  Test: COVID Critical Value: Positive   Name of Provider Notified: MD Pfeiffer  Orders Received? Or Actions Taken?: See Orders
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
## 2020-07-09 ENCOUNTER — Encounter (HOSPITAL_COMMUNITY): Payer: Self-pay

## 2020-07-09 ENCOUNTER — Emergency Department (HOSPITAL_COMMUNITY): Payer: HRSA Program

## 2020-07-09 ENCOUNTER — Telehealth (HOSPITAL_COMMUNITY): Payer: Self-pay | Admitting: Nurse Practitioner

## 2020-07-09 DIAGNOSIS — I161 Hypertensive emergency: Secondary | ICD-10-CM | POA: Diagnosis not present

## 2020-07-09 DIAGNOSIS — U071 COVID-19: Secondary | ICD-10-CM | POA: Diagnosis not present

## 2020-07-09 DIAGNOSIS — H5713 Ocular pain, bilateral: Secondary | ICD-10-CM | POA: Diagnosis not present

## 2020-07-09 DIAGNOSIS — F17210 Nicotine dependence, cigarettes, uncomplicated: Secondary | ICD-10-CM | POA: Diagnosis not present

## 2020-07-09 DIAGNOSIS — K0889 Other specified disorders of teeth and supporting structures: Secondary | ICD-10-CM | POA: Diagnosis not present

## 2020-07-09 DIAGNOSIS — S0990XA Unspecified injury of head, initial encounter: Secondary | ICD-10-CM | POA: Diagnosis not present

## 2020-07-09 DIAGNOSIS — Z5900 Homelessness unspecified: Secondary | ICD-10-CM | POA: Diagnosis not present

## 2020-07-09 DIAGNOSIS — F419 Anxiety disorder, unspecified: Secondary | ICD-10-CM | POA: Diagnosis not present

## 2020-07-09 DIAGNOSIS — J1282 Pneumonia due to coronavirus disease 2019: Secondary | ICD-10-CM | POA: Diagnosis not present

## 2020-07-09 DIAGNOSIS — I10 Essential (primary) hypertension: Secondary | ICD-10-CM | POA: Diagnosis not present

## 2020-07-09 DIAGNOSIS — K219 Gastro-esophageal reflux disease without esophagitis: Secondary | ICD-10-CM | POA: Diagnosis not present

## 2020-07-09 DIAGNOSIS — S0231XA Fracture of orbital floor, right side, initial encounter for closed fracture: Secondary | ICD-10-CM | POA: Diagnosis not present

## 2020-07-09 DIAGNOSIS — R911 Solitary pulmonary nodule: Secondary | ICD-10-CM | POA: Diagnosis not present

## 2020-07-09 DIAGNOSIS — H548 Legal blindness, as defined in USA: Secondary | ICD-10-CM | POA: Diagnosis not present

## 2020-07-09 DIAGNOSIS — S02831A Fracture of medial orbital wall, right side, initial encounter for closed fracture: Secondary | ICD-10-CM | POA: Diagnosis not present

## 2020-07-09 DIAGNOSIS — J019 Acute sinusitis, unspecified: Secondary | ICD-10-CM | POA: Diagnosis not present

## 2020-07-09 DIAGNOSIS — H543 Unqualified visual loss, both eyes: Secondary | ICD-10-CM | POA: Diagnosis not present

## 2020-07-09 DIAGNOSIS — K029 Dental caries, unspecified: Secondary | ICD-10-CM | POA: Diagnosis not present

## 2020-07-09 DIAGNOSIS — H40053 Ocular hypertension, bilateral: Secondary | ICD-10-CM | POA: Diagnosis not present

## 2020-07-09 DIAGNOSIS — Z9181 History of falling: Secondary | ICD-10-CM | POA: Diagnosis not present

## 2020-07-09 DIAGNOSIS — H44003 Unspecified purulent endophthalmitis, bilateral: Secondary | ICD-10-CM | POA: Diagnosis not present

## 2020-07-09 DIAGNOSIS — H4419 Other endophthalmitis: Secondary | ICD-10-CM | POA: Diagnosis not present

## 2020-07-09 DIAGNOSIS — B373 Candidiasis of vulva and vagina: Secondary | ICD-10-CM | POA: Diagnosis not present

## 2020-07-09 DIAGNOSIS — H538 Other visual disturbances: Secondary | ICD-10-CM | POA: Diagnosis not present

## 2020-07-09 DIAGNOSIS — A5219 Other symptomatic neurosyphilis: Secondary | ICD-10-CM | POA: Diagnosis not present

## 2020-07-09 DIAGNOSIS — E119 Type 2 diabetes mellitus without complications: Secondary | ICD-10-CM | POA: Diagnosis not present

## 2020-07-09 DIAGNOSIS — Z049 Encounter for examination and observation for unspecified reason: Secondary | ICD-10-CM | POA: Diagnosis not present

## 2020-07-09 DIAGNOSIS — Z79899 Other long term (current) drug therapy: Secondary | ICD-10-CM | POA: Diagnosis not present

## 2020-07-09 DIAGNOSIS — H44113 Panuveitis, bilateral: Secondary | ICD-10-CM | POA: Diagnosis not present

## 2020-07-09 LAB — B. BURGDORFI ANTIBODIES: B burgdorferi Ab IgG+IgM: <0.91 {ISR} (ref 0.00–0.90)

## 2020-07-09 LAB — HSV(HERPES SIMPLEX VRS) I + II AB-IGG
HSV 1 Glycoprotein G Ab, IgG: 33.2 index — ABNORMAL HIGH (ref 0.00–0.90)
HSV 2 Glycoprotein G Ab, IgG: 6.2 index — ABNORMAL HIGH (ref 0.00–0.90)

## 2020-07-09 LAB — RPR
RPR Ser Ql: REACTIVE — AB
RPR Titer: 1:64 {titer}

## 2020-07-09 LAB — HSV(HERPES SIMPLEX VRS) I + II AB-IGM: HSVI/II Comb IgM: 1.3 Ratio — ABNORMAL HIGH (ref 0.00–0.90)

## 2020-07-09 MED ORDER — LISINOPRIL 20 MG PO TABS
20.0000 mg | ORAL_TABLET | Freq: Once | ORAL | Status: AC
  Administered 2020-07-09: 20 mg via ORAL
  Filled 2020-07-09: qty 1

## 2020-07-09 MED ORDER — IOHEXOL 350 MG/ML SOLN
100.0000 mL | Freq: Once | INTRAVENOUS | Status: AC | PRN
  Administered 2020-07-09: 100 mL via INTRAVENOUS

## 2020-07-09 MED ORDER — POTASSIUM CHLORIDE CRYS ER 20 MEQ PO TBCR: 40.0000 meq | EXTENDED_RELEASE_TABLET | Freq: Once | ORAL | Status: DC

## 2020-07-09 MED ORDER — SODIUM CHLORIDE (PF) 0.9 % IJ SOLN
INTRAMUSCULAR | Status: AC
  Filled 2020-07-09: qty 50

## 2020-07-09 MED ORDER — POTASSIUM CHLORIDE CRYS ER 20 MEQ PO TBCR
40.0000 meq | EXTENDED_RELEASE_TABLET | Freq: Once | ORAL | Status: AC
  Administered 2020-07-09: 40 meq via ORAL
  Filled 2020-07-09: qty 2

## 2020-07-09 NOTE — ED Notes (Signed)
Pt transferred to [facility] at this time. Report given to transport with no further questions at this time. Pt transferred in stable condition at this time.

## 2020-07-09 NOTE — ED Provider Notes (Signed)
Care assumed from Dr. Donnald Garre, with probable endophthalmitis needing transfer to tertiary care facility.  Waiting for bed availability at [facility].  Anticipate hospitalist admission if no bed available at [facility] by morning.  D-dimer has come back elevated, will send for CT angiogram of the chest.  Potassium is come back low, will give oral potassium.  Drug screen noted to be positive for opiates, cocaine, marijuana.  Inflammatory markers generally elevated.  HIV screen is negative.  Have discussed with [facility], they are still trying to find appropriate bed for patient.  Case has been discussed with Dr.Wilson of internal medicine service at [facility] and he agrees to accept the patient in consultation.  Currently waiting for CT angiogram report.  She has already received IV antibiotics and topical eyedrops for glaucoma.  CT angiogram shows minimal pulmonary infiltrates consistent with COVID-19 pneumonia, no evidence of pulmonary embolism.  Also, 1 cm right upper lobe nodule with some internal calcification which will need follow-up.  No evidence of active pulmonary tuberculosis.  These findings were discussed with Dr.Wilson and patient is transferred to [facility].  Results for orders placed or performed during the hospital encounter of 07/08/20  Resp Panel by RT-PCR (Flu A&B, Covid) Nasopharyngeal Swab   Specimen: Nasopharyngeal Swab; Nasopharyngeal(NP) swabs in vial transport medium  Result Value Ref Range   SARS Coronavirus 2 by RT PCR POSITIVE (A) NEGATIVE   Influenza A by PCR NEGATIVE NEGATIVE   Influenza B by PCR NEGATIVE NEGATIVE  Comprehensive metabolic panel  Result Value Ref Range   Sodium 136 135 - 145 mmol/L   Potassium 3.2 (L) 3.5 - 5.1 mmol/L   Chloride 98 98 - 111 mmol/L   CO2 25 22 - 32 mmol/L   Glucose, Bld 160 (H) 70 - 99 mg/dL   BUN 8 6 - 20 mg/dL   Creatinine, Ser 1.24 0.44 - 1.00 mg/dL    Calcium 9.1 8.9 - 58.0 mg/dL   Total Protein 9.2 (H) 6.5 - 8.1 g/dL   Albumin 3.7 3.5 - 5.0 g/dL   AST 12 (L) 15 - 41 U/L   ALT 13 0 - 44 U/L   Alkaline Phosphatase 73 38 - 126 U/L   Total Bilirubin 0.6 0.3 - 1.2 mg/dL   GFR, Estimated >99 >83 mL/min   Anion gap 13 5 - 15  Ethanol  Result Value Ref Range   Alcohol, Ethyl (B) <10 <10 mg/dL  Lipase, blood  Result Value Ref Range   Lipase 24 11 - 51 U/L  Lactic acid, plasma  Result Value Ref Range   Lactic Acid, Venous 1.3 0.5 - 1.9 mmol/L  Lactic acid, plasma  Result Value Ref Range   Lactic Acid, Venous 1.0 0.5 - 1.9 mmol/L  CBC with Differential  Result Value Ref Range   WBC 12.1 (H) 4.0 - 10.5 K/uL   RBC 6.04 (H) 3.87 - 5.11 MIL/uL   Hemoglobin 14.0 12.0 - 15.0 g/dL   HCT 38.2 36 - 46 %   MCV 74.5 (L) 80.0 - 100.0 fL   MCH 23.2 (L) 26.0 - 34.0 pg   MCHC 31.1 30.0 - 36.0 g/dL   RDW 50.5 (H) 39.7 - 67.3 %   Platelets 524 (H) 150 - 400 K/uL   nRBC 0.0 0.0 - 0.2 %   Neutrophils Relative % 78 %   Neutro Abs 9.5 (H) 1.7 - 7.7 K/uL   Lymphocytes Relative 16 %  Lymphs Abs 1.9 0.7 - 4.0 K/uL   Monocytes Relative 4 %   Monocytes Absolute 0.5 0.1 - 1.0 K/uL   Eosinophils Relative 1 %   Eosinophils Absolute 0.1 0.0 - 0.5 K/uL   Basophils Relative 1 %   Basophils Absolute 0.1 0.0 - 0.1 K/uL   Immature Granulocytes 0 %   Abs Immature Granulocytes 0.04 0.00 - 0.07 K/uL  Protime-INR  Result Value Ref Range   Prothrombin Time 13.6 11.4 - 15.2 seconds   INR 1.1 0.8 - 1.2  Urinalysis, Routine w reflex microscopic Urine, Catheterized  Result Value Ref Range   Color, Urine YELLOW YELLOW   APPearance CLEAR CLEAR   Specific Gravity, Urine 1.011 1.005 - 1.030   pH 6.0 5.0 - 8.0   Glucose, UA NEGATIVE NEGATIVE mg/dL   Hgb urine dipstick NEGATIVE NEGATIVE   Bilirubin Urine NEGATIVE NEGATIVE   Ketones, ur 5 (A) NEGATIVE mg/dL   Protein, ur NEGATIVE NEGATIVE mg/dL   Nitrite NEGATIVE NEGATIVE   Leukocytes,Ua NEGATIVE NEGATIVE   Urine rapid drug screen (hosp performed)  Result Value Ref Range   Opiates POSITIVE (A) NONE DETECTED   Cocaine POSITIVE (A) NONE DETECTED   Benzodiazepines NONE DETECTED NONE DETECTED   Amphetamines NONE DETECTED NONE DETECTED   Tetrahydrocannabinol POSITIVE (A) NONE DETECTED   Barbiturates NONE DETECTED NONE DETECTED  HIV Antibody (routine testing w rflx)  Result Value Ref Range   HIV Screen 4th Generation wRfx Non Reactive Non Reactive  Sedimentation rate  Result Value Ref Range   Sed Rate 68 (H) 0 - 22 mm/hr  C-reactive protein  Result Value Ref Range   CRP 2.2 (H) <1.0 mg/dL  D-dimer, quantitative  Result Value Ref Range   D-Dimer, Quant 0.62 (H) 0.00 - 0.50 ug/mL-FEU  Procalcitonin  Result Value Ref Range   Procalcitonin <0.10 ng/mL  Lactate dehydrogenase  Result Value Ref Range   LDH 109 98 - 192 U/L  Ferritin  Result Value Ref Range   Ferritin 55 11 - 307 ng/mL  Triglycerides  Result Value Ref Range   Triglycerides 229 (H) <150 mg/dL  Fibrinogen  Result Value Ref Range   Fibrinogen 571 (H) 210 - 475 mg/dL  I-Stat beta hCG blood, ED  Result Value Ref Range   I-stat hCG, quantitative <5.0 <5 mIU/mL   Comment 3           CT Head Wo Contrast  Result Date: 07/08/2020 CLINICAL DATA:  Head injury 2 weeks ago with worsening vision. EXAM: CT HEAD AND ORBITS WITHOUT CONTRAST TECHNIQUE: Contiguous axial images were obtained from the base of the skull through the vertex without contrast. Multidetector CT imaging of the orbits was performed using the standard protocol without intravenous contrast. COMPARISON:  None. FINDINGS: CT HEAD FINDINGS Brain: There is no evidence for acute hemorrhage, hydrocephalus, mass lesion, or abnormal extra-axial fluid collection. No definite CT evidence for acute infarction. Vascular: No hyperdense vessel or unexpected calcification. Skull: No evidence for fracture. No worrisome lytic or sclerotic lesion. Other: None. CT ORBITS FINDINGS Orbits:  Blowout fracture noted medial right orbital wall. No hemorrhage in the adjacent ethmoid sinus and no gas in the right orbital fat, features suggesting chronic injury. Intra orbital fat is preserved bilaterally. Muscle bellies of the extraocular muscles are symmetric in size. Visualized sinuses: Trace mucosal thickening in the sphenoid sinuses compatible with chronic sinusitis. Soft tissues: Unremarkable. IMPRESSION: 1. No acute intracranial abnormality. 2. Blowout fracture medial wall of right orbit without evidence for orbital  hematoma or hemorrhage in the adjacent ethmoid sinus. Appearance suggests chronic injury. 3. Trace mucosal thickening in the sphenoid sinuses compatible with chronic sinusitis. Electronically Signed   By: Kennith CenterEric  Mansell M.D.   On: 07/08/2020 18:30   CT Angio Chest PE W and/or Wo Contrast  Result Date: 07/09/2020 CLINICAL DATA:  Elevated D-dimer.  COVID positive EXAM: CT ANGIOGRAPHY CHEST WITH CONTRAST TECHNIQUE: Multidetector CT imaging of the chest was performed using the standard protocol during bolus administration of intravenous contrast. Multiplanar CT image reconstructions and MIPs were obtained to evaluate the vascular anatomy. CONTRAST:  100mL OMNIPAQUE IOHEXOL 350 MG/ML SOLN COMPARISON:  None. FINDINGS: Cardiovascular: Satisfactory opacification of the pulmonary arteries to the segmental level. No evidence of pulmonary embolism when allowing for levels of streak artifact. Normal heart size. No pericardial effusion. Atheromatous wall thickening of the aorta and proximal great vessels, notable for age. Mediastinum/Nodes: Negative for adenopathy or air leak Lungs/Pleura: Airway thickening with clusters of indistinct centrilobular nodules. 1 cm right upper lobe nodule which has some faint internal high-density that is indeterminate in the setting of is streak artifact from intravenous contrast in the adjacent SVC. Upper Abdomen: Negative Musculoskeletal: Spondylosis. Review of the  MIP images confirms the above findings. IMPRESSION: 1. Bronchitis with patchy bilateral pneumonia. 2. 1 cm right upper lobe pulmonary nodule. Probable internal calcification, but certainty is limited by streak artifact from intravenous contrast. Recommend follow-up noncontrast chest CT in 3 months. This recommendation follows the consensus statement: Guidelines for Management of Incidental Pulmonary Nodules Detected on CT Images: From the Fleischner Society 2017; Radiology 2017; 284:228-243. 3. Negative for pulmonary embolism. Electronically Signed   By: Marnee SpringJonathon  Watts M.D.   On: 07/09/2020 04:18   DG Chest Port 1 View  Result Date: 07/08/2020 CLINICAL DATA:  COVID-19 positive, cough, smoker EXAM: PORTABLE CHEST 1 VIEW COMPARISON:  Radiograph 03/03/2013 FINDINGS: No consolidation, features of edema, pneumothorax, or effusion. Pulmonary vascularity is normally distributed. The cardiomediastinal contours are unremarkable. No acute osseous or soft tissue abnormality. Telemetry leads overlie the chest. IMPRESSION: No acute cardiopulmonary abnormality. Electronically Signed   By: Kreg ShropshirePrice  DeHay M.D.   On: 07/08/2020 20:47   CT Orbits Wo Contrast  Result Date: 07/08/2020 CLINICAL DATA:  Head injury 2 weeks ago with worsening vision. EXAM: CT HEAD AND ORBITS WITHOUT CONTRAST TECHNIQUE: Contiguous axial images were obtained from the base of the skull through the vertex without contrast. Multidetector CT imaging of the orbits was performed using the standard protocol without intravenous contrast. COMPARISON:  None. FINDINGS: CT HEAD FINDINGS Brain: There is no evidence for acute hemorrhage, hydrocephalus, mass lesion, or abnormal extra-axial fluid collection. No definite CT evidence for acute infarction. Vascular: No hyperdense vessel or unexpected calcification. Skull: No evidence for fracture. No worrisome lytic or sclerotic lesion. Other: None. CT ORBITS FINDINGS Orbits: Blowout fracture noted medial right  orbital wall. No hemorrhage in the adjacent ethmoid sinus and no gas in the right orbital fat, features suggesting chronic injury. Intra orbital fat is preserved bilaterally. Muscle bellies of the extraocular muscles are symmetric in size. Visualized sinuses: Trace mucosal thickening in the sphenoid sinuses compatible with chronic sinusitis. Soft tissues: Unremarkable. IMPRESSION: 1. No acute intracranial abnormality. 2. Blowout fracture medial wall of right orbit without evidence for orbital hematoma or hemorrhage in the adjacent ethmoid sinus. Appearance suggests chronic injury. 3. Trace mucosal thickening in the sphenoid sinuses compatible with chronic sinusitis. Electronically Signed   By: Kennith CenterEric  Mansell M.D.   On: 07/08/2020 18:30  Images viewed by me.  CRITICAL CARE Performed by: Dione Booze Total critical care time: 45 minutes Critical care time was exclusive of separately billable procedures and treating other patients. Critical care was necessary to treat or prevent imminent or life-threatening deterioration. Critical care was time spent personally by me on the following activities: development of treatment plan with patient and/or surrogate as well as nursing, discussions with consultants, evaluation of patient's response to treatment, examination of patient, obtaining history from patient or surrogate, ordering and performing treatments and interventions, ordering and review of laboratory studies, ordering and review of radiographic studies, pulse oximetry and re-evaluation of patient's condition.   Dione Booze, MD 07/09/20 [phone]

## 2020-07-09 NOTE — ED Notes (Signed)
Transport called; they state it will probably be after 7am

## 2020-07-09 NOTE — Telephone Encounter (Signed)
Patient is homeless.  Unable to leave a voice mail on both numbers listed.

## 2020-07-09 NOTE — ED Notes (Addendum)
Pt's roommate [facility] updated

## 2020-07-09 NOTE — ED Notes (Signed)
[employer] [phone] - report [phone] - charge [phone]- transportation

## 2020-07-10 DIAGNOSIS — H547 Unspecified visual loss: Secondary | ICD-10-CM | POA: Diagnosis not present

## 2020-07-10 DIAGNOSIS — H44003 Unspecified purulent endophthalmitis, bilateral: Secondary | ICD-10-CM | POA: Diagnosis not present

## 2020-07-10 DIAGNOSIS — H5713 Ocular pain, bilateral: Secondary | ICD-10-CM | POA: Diagnosis not present

## 2020-07-10 DIAGNOSIS — K219 Gastro-esophageal reflux disease without esophagitis: Secondary | ICD-10-CM | POA: Diagnosis not present

## 2020-07-10 DIAGNOSIS — A539 Syphilis, unspecified: Secondary | ICD-10-CM | POA: Diagnosis not present

## 2020-07-10 DIAGNOSIS — U071 COVID-19: Secondary | ICD-10-CM | POA: Diagnosis not present

## 2020-07-10 DIAGNOSIS — J1282 Pneumonia due to coronavirus disease 2019: Secondary | ICD-10-CM | POA: Diagnosis not present

## 2020-07-10 DIAGNOSIS — Z5900 Homelessness unspecified: Secondary | ICD-10-CM | POA: Diagnosis not present

## 2020-07-10 DIAGNOSIS — H40053 Ocular hypertension, bilateral: Secondary | ICD-10-CM | POA: Diagnosis not present

## 2020-07-10 LAB — QUANTIFERON-TB GOLD PLUS (RQFGPL)
QuantiFERON Mitogen Value: >10 IU/mL
QuantiFERON Nil Value: 0.02 IU/mL
QuantiFERON TB1 Ag Value: 0.02 IU/mL
QuantiFERON TB2 Ag Value: 0.02 IU/mL

## 2020-07-10 LAB — QUANTIFERON-TB GOLD PLUS: QuantiFERON-TB Gold Plus: NEGATIVE

## 2020-07-10 LAB — T.PALLIDUM AB, TOTAL: T Pallidum Abs: REACTIVE — AB

## 2020-07-11 DIAGNOSIS — K219 Gastro-esophageal reflux disease without esophagitis: Secondary | ICD-10-CM | POA: Diagnosis not present

## 2020-07-11 DIAGNOSIS — H547 Unspecified visual loss: Secondary | ICD-10-CM | POA: Diagnosis not present

## 2020-07-11 DIAGNOSIS — U071 COVID-19: Secondary | ICD-10-CM | POA: Diagnosis not present

## 2020-07-11 DIAGNOSIS — A539 Syphilis, unspecified: Secondary | ICD-10-CM | POA: Diagnosis not present

## 2020-07-11 DIAGNOSIS — J1282 Pneumonia due to coronavirus disease 2019: Secondary | ICD-10-CM | POA: Diagnosis not present

## 2020-07-11 DIAGNOSIS — H44003 Unspecified purulent endophthalmitis, bilateral: Secondary | ICD-10-CM | POA: Diagnosis not present

## 2020-07-11 DIAGNOSIS — H5713 Ocular pain, bilateral: Secondary | ICD-10-CM | POA: Diagnosis not present

## 2020-07-11 DIAGNOSIS — Z5900 Homelessness unspecified: Secondary | ICD-10-CM | POA: Diagnosis not present

## 2020-07-12 DIAGNOSIS — A539 Syphilis, unspecified: Secondary | ICD-10-CM | POA: Diagnosis not present

## 2020-07-12 DIAGNOSIS — K219 Gastro-esophageal reflux disease without esophagitis: Secondary | ICD-10-CM | POA: Diagnosis not present

## 2020-07-12 DIAGNOSIS — H547 Unspecified visual loss: Secondary | ICD-10-CM | POA: Diagnosis not present

## 2020-07-12 DIAGNOSIS — Z5900 Homelessness unspecified: Secondary | ICD-10-CM | POA: Diagnosis not present

## 2020-07-12 DIAGNOSIS — H44003 Unspecified purulent endophthalmitis, bilateral: Secondary | ICD-10-CM | POA: Diagnosis not present

## 2020-07-12 DIAGNOSIS — H5713 Ocular pain, bilateral: Secondary | ICD-10-CM | POA: Diagnosis not present

## 2020-07-12 DIAGNOSIS — J1282 Pneumonia due to coronavirus disease 2019: Secondary | ICD-10-CM | POA: Diagnosis not present

## 2020-07-12 DIAGNOSIS — U071 COVID-19: Secondary | ICD-10-CM | POA: Diagnosis not present

## 2020-07-13 DIAGNOSIS — K219 Gastro-esophageal reflux disease without esophagitis: Secondary | ICD-10-CM | POA: Diagnosis not present

## 2020-07-13 DIAGNOSIS — J1282 Pneumonia due to coronavirus disease 2019: Secondary | ICD-10-CM | POA: Diagnosis not present

## 2020-07-13 DIAGNOSIS — U071 COVID-19: Secondary | ICD-10-CM | POA: Diagnosis not present

## 2020-07-13 DIAGNOSIS — H44003 Unspecified purulent endophthalmitis, bilateral: Secondary | ICD-10-CM | POA: Diagnosis not present

## 2020-07-13 LAB — CULTURE, BLOOD (ROUTINE X 2)
Culture: NO GROWTH
Culture: NO GROWTH
Special Requests: ADEQUATE
Special Requests: ADEQUATE

## 2020-07-14 DIAGNOSIS — H44003 Unspecified purulent endophthalmitis, bilateral: Secondary | ICD-10-CM | POA: Diagnosis not present

## 2020-07-14 DIAGNOSIS — K219 Gastro-esophageal reflux disease without esophagitis: Secondary | ICD-10-CM | POA: Diagnosis not present

## 2020-07-14 DIAGNOSIS — U071 COVID-19: Secondary | ICD-10-CM | POA: Diagnosis not present

## 2020-07-14 DIAGNOSIS — J1282 Pneumonia due to coronavirus disease 2019: Secondary | ICD-10-CM | POA: Diagnosis not present

## 2020-07-15 DIAGNOSIS — U071 COVID-19: Secondary | ICD-10-CM | POA: Diagnosis not present

## 2020-07-15 DIAGNOSIS — A5271 Late syphilitic oculopathy: Secondary | ICD-10-CM | POA: Diagnosis not present

## 2020-07-15 DIAGNOSIS — H543 Unqualified visual loss, both eyes: Secondary | ICD-10-CM | POA: Diagnosis not present

## 2020-07-15 DIAGNOSIS — K219 Gastro-esophageal reflux disease without esophagitis: Secondary | ICD-10-CM | POA: Diagnosis not present

## 2020-07-15 DIAGNOSIS — H44003 Unspecified purulent endophthalmitis, bilateral: Secondary | ICD-10-CM | POA: Diagnosis not present

## 2020-07-15 DIAGNOSIS — J1282 Pneumonia due to coronavirus disease 2019: Secondary | ICD-10-CM | POA: Diagnosis not present

## 2020-07-15 DIAGNOSIS — H547 Unspecified visual loss: Secondary | ICD-10-CM | POA: Diagnosis not present

## 2020-07-15 DIAGNOSIS — H5713 Ocular pain, bilateral: Secondary | ICD-10-CM | POA: Diagnosis not present

## 2020-07-16 DIAGNOSIS — H543 Unqualified visual loss, both eyes: Secondary | ICD-10-CM | POA: Diagnosis not present

## 2020-07-16 DIAGNOSIS — U071 COVID-19: Secondary | ICD-10-CM | POA: Diagnosis not present

## 2020-07-16 DIAGNOSIS — K0889 Other specified disorders of teeth and supporting structures: Secondary | ICD-10-CM | POA: Diagnosis not present

## 2020-07-16 DIAGNOSIS — L292 Pruritus vulvae: Secondary | ICD-10-CM | POA: Diagnosis not present

## 2020-07-16 DIAGNOSIS — H44003 Unspecified purulent endophthalmitis, bilateral: Secondary | ICD-10-CM | POA: Diagnosis not present

## 2020-07-16 DIAGNOSIS — H5713 Ocular pain, bilateral: Secondary | ICD-10-CM | POA: Diagnosis not present

## 2020-07-16 DIAGNOSIS — H547 Unspecified visual loss: Secondary | ICD-10-CM | POA: Diagnosis not present

## 2020-07-16 DIAGNOSIS — A5271 Late syphilitic oculopathy: Secondary | ICD-10-CM | POA: Diagnosis not present

## 2020-07-16 DIAGNOSIS — J1282 Pneumonia due to coronavirus disease 2019: Secondary | ICD-10-CM | POA: Diagnosis not present

## 2020-07-16 DIAGNOSIS — K219 Gastro-esophageal reflux disease without esophagitis: Secondary | ICD-10-CM | POA: Diagnosis not present

## 2020-07-17 DIAGNOSIS — H5713 Ocular pain, bilateral: Secondary | ICD-10-CM | POA: Diagnosis not present

## 2020-07-17 DIAGNOSIS — A5271 Late syphilitic oculopathy: Secondary | ICD-10-CM | POA: Diagnosis not present

## 2020-07-17 DIAGNOSIS — H543 Unqualified visual loss, both eyes: Secondary | ICD-10-CM | POA: Diagnosis not present

## 2020-07-17 DIAGNOSIS — U071 COVID-19: Secondary | ICD-10-CM | POA: Diagnosis not present

## 2020-07-17 DIAGNOSIS — K219 Gastro-esophageal reflux disease without esophagitis: Secondary | ICD-10-CM | POA: Diagnosis not present

## 2020-07-17 DIAGNOSIS — J1282 Pneumonia due to coronavirus disease 2019: Secondary | ICD-10-CM | POA: Diagnosis not present

## 2020-07-17 DIAGNOSIS — L292 Pruritus vulvae: Secondary | ICD-10-CM | POA: Diagnosis not present

## 2020-07-17 DIAGNOSIS — H547 Unspecified visual loss: Secondary | ICD-10-CM | POA: Diagnosis not present

## 2020-07-17 DIAGNOSIS — H44003 Unspecified purulent endophthalmitis, bilateral: Secondary | ICD-10-CM | POA: Diagnosis not present

## 2020-07-18 DIAGNOSIS — H543 Unqualified visual loss, both eyes: Secondary | ICD-10-CM | POA: Diagnosis not present

## 2020-07-18 DIAGNOSIS — U071 COVID-19: Secondary | ICD-10-CM | POA: Diagnosis not present

## 2020-07-18 DIAGNOSIS — A5271 Late syphilitic oculopathy: Secondary | ICD-10-CM | POA: Diagnosis not present

## 2020-07-18 DIAGNOSIS — H547 Unspecified visual loss: Secondary | ICD-10-CM | POA: Diagnosis not present

## 2020-07-18 DIAGNOSIS — J1282 Pneumonia due to coronavirus disease 2019: Secondary | ICD-10-CM | POA: Diagnosis not present

## 2020-07-18 DIAGNOSIS — K219 Gastro-esophageal reflux disease without esophagitis: Secondary | ICD-10-CM | POA: Diagnosis not present

## 2020-07-18 DIAGNOSIS — H5713 Ocular pain, bilateral: Secondary | ICD-10-CM | POA: Diagnosis not present

## 2020-07-18 DIAGNOSIS — L292 Pruritus vulvae: Secondary | ICD-10-CM | POA: Diagnosis not present

## 2020-07-18 DIAGNOSIS — H44003 Unspecified purulent endophthalmitis, bilateral: Secondary | ICD-10-CM | POA: Diagnosis not present

## 2020-07-19 DIAGNOSIS — L292 Pruritus vulvae: Secondary | ICD-10-CM | POA: Diagnosis not present

## 2020-07-19 DIAGNOSIS — U071 COVID-19: Secondary | ICD-10-CM | POA: Diagnosis not present

## 2020-07-19 DIAGNOSIS — J1282 Pneumonia due to coronavirus disease 2019: Secondary | ICD-10-CM | POA: Diagnosis not present

## 2020-07-19 DIAGNOSIS — K219 Gastro-esophageal reflux disease without esophagitis: Secondary | ICD-10-CM | POA: Diagnosis not present

## 2020-07-20 DIAGNOSIS — U071 COVID-19: Secondary | ICD-10-CM | POA: Diagnosis not present

## 2020-07-20 DIAGNOSIS — K219 Gastro-esophageal reflux disease without esophagitis: Secondary | ICD-10-CM | POA: Diagnosis not present

## 2020-07-20 DIAGNOSIS — J1282 Pneumonia due to coronavirus disease 2019: Secondary | ICD-10-CM | POA: Diagnosis not present

## 2020-07-20 DIAGNOSIS — L292 Pruritus vulvae: Secondary | ICD-10-CM | POA: Diagnosis not present

## 2020-07-21 DIAGNOSIS — K219 Gastro-esophageal reflux disease without esophagitis: Secondary | ICD-10-CM | POA: Diagnosis not present

## 2020-07-21 DIAGNOSIS — J1282 Pneumonia due to coronavirus disease 2019: Secondary | ICD-10-CM | POA: Diagnosis not present

## 2020-07-21 DIAGNOSIS — L292 Pruritus vulvae: Secondary | ICD-10-CM | POA: Diagnosis not present

## 2020-07-21 DIAGNOSIS — U071 COVID-19: Secondary | ICD-10-CM | POA: Diagnosis not present

## 2020-07-22 DIAGNOSIS — U071 COVID-19: Secondary | ICD-10-CM | POA: Diagnosis not present

## 2020-07-22 DIAGNOSIS — K219 Gastro-esophageal reflux disease without esophagitis: Secondary | ICD-10-CM | POA: Diagnosis not present

## 2020-07-22 DIAGNOSIS — J1282 Pneumonia due to coronavirus disease 2019: Secondary | ICD-10-CM | POA: Diagnosis not present

## 2020-07-22 DIAGNOSIS — L292 Pruritus vulvae: Secondary | ICD-10-CM | POA: Diagnosis not present

## 2020-07-23 DIAGNOSIS — J1282 Pneumonia due to coronavirus disease 2019: Secondary | ICD-10-CM | POA: Diagnosis not present

## 2020-07-23 DIAGNOSIS — U071 COVID-19: Secondary | ICD-10-CM | POA: Diagnosis not present

## 2020-07-23 DIAGNOSIS — H44003 Unspecified purulent endophthalmitis, bilateral: Secondary | ICD-10-CM | POA: Diagnosis not present

## 2020-07-24 DIAGNOSIS — J1282 Pneumonia due to coronavirus disease 2019: Secondary | ICD-10-CM | POA: Diagnosis not present

## 2020-07-24 DIAGNOSIS — U071 COVID-19: Secondary | ICD-10-CM | POA: Diagnosis not present

## 2020-07-24 DIAGNOSIS — H44003 Unspecified purulent endophthalmitis, bilateral: Secondary | ICD-10-CM | POA: Diagnosis not present

## 2020-07-25 DIAGNOSIS — H44003 Unspecified purulent endophthalmitis, bilateral: Secondary | ICD-10-CM | POA: Diagnosis not present

## 2020-07-25 DIAGNOSIS — J1282 Pneumonia due to coronavirus disease 2019: Secondary | ICD-10-CM | POA: Diagnosis not present

## 2020-07-25 DIAGNOSIS — U071 COVID-19: Secondary | ICD-10-CM | POA: Diagnosis not present

## 2020-11-01 ENCOUNTER — Other Ambulatory Visit: Payer: Self-pay

## 2020-11-01 ENCOUNTER — Encounter (HOSPITAL_COMMUNITY): Payer: Self-pay | Admitting: Emergency Medicine

## 2020-11-01 ENCOUNTER — Ambulatory Visit (HOSPITAL_COMMUNITY)
Admission: EM | Admit: 2020-11-01 | Discharge: 2020-11-01 | Disposition: A | Discharge status: Home / Self Care | Payer: Medicaid Other

## 2020-11-01 DIAGNOSIS — K0889 Other specified disorders of teeth and supporting structures: Secondary | ICD-10-CM | POA: Diagnosis not present

## 2020-11-01 HISTORY — DX: Unspecified visual loss: H54.7

## 2020-11-01 MED ORDER — AMOXICILLIN-POT CLAVULANATE 875-125 MG PO TABS: 1.0000 | ORAL_TABLET | Freq: Two times a day (BID) | ORAL | 0 refills | Status: DC

## 2020-11-01 MED ORDER — LIDOCAINE VISCOUS HCL 2 % MT SOLN: 10.0000 mL | OROMUCOSAL | 0 refills | Status: AC | PRN

## 2020-11-01 NOTE — ED Triage Notes (Signed)
Pt presents today with left upper tooth pain x 1 year. 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 She just learned that she has Medicaid coverage and plans to make appt with dentist in future.

## 2020-11-01 NOTE — ED Provider Notes (Signed)
[facility]    CSN: [id number] Arrival date & time: 11/01/20  1250      History   Chief Complaint Chief Complaint  Patient presents with  . Dental Pain    Left upper    HPI Haley Griffith is a 50 y.o. female.   Patient presenting today with several day history of left upper dental pain and swelling.  States this is something that has been ongoing with intermittent flares for the past year or 2.  She is working on getting in with a dentist at this time.  Denies fever, drainage, sore throat, headaches, new injury.  Trying salt water gargles and over-the-counter pain relievers without much relief.     Past Medical History:  Diagnosis Date  . Blind   . Hypertension     Patient Active Problem List   Diagnosis Date Noted  . JAW PAIN 06/10/2006  . FURUNCLE 06/10/2006    Past Surgical History:  Procedure Laterality Date  . BREAST SURGERY    . MOUTH SURGERY      OB History   No obstetric history on file.      Home Medications    Prior to Admission medications   Medication Sig Start Date End Date Taking? Authorizing Provider  atropine 1 % ophthalmic solution Place 1 drop into both eyes in the morning and at bedtime. 07/25/20  Yes [provider]  brimonidine (ALPHAGAN) 0.2 % ophthalmic solution Place 1 drop into both eyes 3 times daily. 07/25/20  Yes [provider]  dorzolamide-timolol (COSOPT) 22.3-6.8 MG/ML ophthalmic solution Place 1 drop into both eyes 2 times daily. 07/25/20  Yes [provider]  ibuprofen (ADVIL) 200 MG tablet Take 800 mg by mouth every 6 (six) hours as needed for moderate pain.   Yes [provider]  latanoprost (XALATAN) 0.005 % ophthalmic solution Place 1 drop into both eyes nightly. 07/25/20  Yes [provider]  lidocaine (XYLOCAINE) 2 % solution Use as directed 10 mLs in the mouth or throat as needed for mouth pain. 11/01/20  Yes Particia Nearing, PA-C  metFORMIN  (GLUCOPHAGE) 500 MG tablet Take by mouth. 07/25/20  Yes [provider]  prednisoLONE acetate (PRED FORTE) 1 % ophthalmic suspension Place 1 drop into both eyes 4 times daily. 07/25/20  Yes [provider]  amoxicillin-clavulanate (AUGMENTIN) 875-125 MG tablet Take 1 tablet by mouth every 12 (twelve) hours. 11/01/20   Particia Nearing, PA-C  HYDROcodone-acetaminophen (NORCO) 5-325 MG tablet Take 1 tablet by mouth every 6 (six) hours as needed for moderate pain. Patient not taking: No sig reported 04/30/19   Elvina Sidle, MD  Nerve Stimulator (PRO COMFORT TENS UNIT) DEVI by Does not apply route.    [provider]  timolol (TIMOPTIC) 0.5 % ophthalmic solution Place 1 drop into the left eye every 12 (twelve) hours. Patient taking differently: Place 1 drop into both eyes every 12 (twelve) hours.  06/25/20   Nira Conn, MD    Family History Family History  Problem Relation Age of Onset  . Cancer Mother   . Cancer Father     Social History Social History   Tobacco Use  . Smoking status: Current Every Day Smoker    Packs/day: 0.50    Types: Cigarettes  . Smokeless tobacco: Never Used  Vaping Use  . Vaping Use: Never used  Substance Use Topics  . Alcohol use: Yes    Comment: occ  . Drug use: Never  Allergies   Patient has no known allergies.   Review of Systems Review of Systems Per HPI  Physical Exam Triage Vital Signs ED Triage Vitals  Enc Vitals Group     BP 11/01/20 1333 (!) 149/81     Pulse Rate 11/01/20 1333 74     Resp 11/01/20 1333 16     Temp 11/01/20 1333 98 F (36.7 C)     Temp Source 11/01/20 1333 Oral     SpO2 11/01/20 1333 98 %     Weight --      Height --      Head Circumference --      Peak Flow --      Pain Score 11/01/20 1326 9     Pain Loc --      Pain Edu? --      Excl. in GC? --    No data found.  Updated Vital Signs BP (!) 149/81 (BP Location: Right Arm)   Pulse 74   Temp 98 F (36.7  C) (Oral)   Resp 16   LMP 10/08/2020 (Approximate)   SpO2 98%   Visual Acuity Right Eye Distance:   Left Eye Distance:   Bilateral Distance:    Right Eye Near:   Left Eye Near:    Bilateral Near:     Physical Exam Vitals and nursing note reviewed.  Constitutional:      Appearance: Normal appearance. She is not ill-appearing.  HENT:     Head: Atraumatic.     Nose: Nose normal.     Mouth/Throat:     Mouth: Mucous membranes are moist.     Pharynx: Oropharynx is clear. No oropharyngeal exudate.     Comments: Decay present of left upper posterior molars, area of pain with loose molar that patient is able to manipulate back-and-forth.  No obvious abscess present on exam Eyes:     Extraocular Movements: Extraocular movements intact.     Conjunctiva/sclera: Conjunctivae normal.  Cardiovascular:     Rate and Rhythm: Normal rate and regular rhythm.     Heart sounds: Normal heart sounds.  Pulmonary:     Effort: Pulmonary effort is normal.     Breath sounds: Normal breath sounds.  Musculoskeletal:        General: Normal range of motion.     Cervical back: Normal range of motion and neck supple.  Skin:    General: Skin is warm and dry.  Neurological:     Mental Status: She is alert and oriented to person, place, and time.  Psychiatric:        Mood and Affect: Mood normal.        Thought Content: Thought content normal.        Judgment: Judgment normal.      UC Treatments / Results  Labs (all labs ordered are listed, but only abnormal results are displayed) Labs Reviewed - No data to display  EKG   Radiology No results found.  Procedures Procedures (including critical care time)  Medications Ordered in UC Medications - No data to display  Initial Impression / Assessment and Plan / UC Course  I have reviewed the triage vital signs and the nursing notes.  Pertinent labs & imaging results that were available during my care of the patient were reviewed by me and  considered in my medical decision making (see chart for details).     We will cover with Augmentin, viscous lidocaine until she can get in with a dentist.  Salt water gargles, good dental hygiene, over-the-counter pain relievers reviewed as well.  Final Clinical Impressions(s) / UC Diagnoses   Final diagnoses:  Pain, dental   Discharge Instructions   None    ED Prescriptions    Medication Sig Dispense Auth. Provider   amoxicillin-clavulanate (AUGMENTIN) 875-125 MG tablet Take 1 tablet by mouth every 12 (twelve) hours. 14 tablet Particia Nearing, [place]   lidocaine (XYLOCAINE) 2 % solution Use as directed 10 mLs in the mouth or throat as needed for mouth pain. 100 mL Particia Nearing, [place]     PDMP not reviewed this encounter.   Particia Nearing, [place] 11/01/20 1429

## 2020-11-08 DIAGNOSIS — H44003 Unspecified purulent endophthalmitis, bilateral: Secondary | ICD-10-CM | POA: Diagnosis not present

## 2020-11-08 DIAGNOSIS — Z79899 Other long term (current) drug therapy: Secondary | ICD-10-CM | POA: Diagnosis not present

## 2020-11-08 DIAGNOSIS — H40052 Ocular hypertension, left eye: Secondary | ICD-10-CM | POA: Diagnosis not present

## 2020-11-08 DIAGNOSIS — Z419 Encounter for procedure for purposes other than remedying health state, unspecified: Secondary | ICD-10-CM | POA: Diagnosis not present

## 2020-12-08 DIAGNOSIS — Z419 Encounter for procedure for purposes other than remedying health state, unspecified: Secondary | ICD-10-CM | POA: Diagnosis not present

## 2021-01-08 DIAGNOSIS — Z419 Encounter for procedure for purposes other than remedying health state, unspecified: Secondary | ICD-10-CM | POA: Diagnosis not present

## 2021-02-07 DIAGNOSIS — Z419 Encounter for procedure for purposes other than remedying health state, unspecified: Secondary | ICD-10-CM | POA: Diagnosis not present

## 2021-03-10 DIAGNOSIS — Z419 Encounter for procedure for purposes other than remedying health state, unspecified: Secondary | ICD-10-CM | POA: Diagnosis not present

## 2021-04-10 DIAGNOSIS — Z419 Encounter for procedure for purposes other than remedying health state, unspecified: Secondary | ICD-10-CM | POA: Diagnosis not present

## 2021-05-10 DIAGNOSIS — Z419 Encounter for procedure for purposes other than remedying health state, unspecified: Secondary | ICD-10-CM | POA: Diagnosis not present

## 2021-06-10 DIAGNOSIS — Z419 Encounter for procedure for purposes other than remedying health state, unspecified: Secondary | ICD-10-CM | POA: Diagnosis not present

## 2021-07-10 DIAGNOSIS — Z419 Encounter for procedure for purposes other than remedying health state, unspecified: Secondary | ICD-10-CM | POA: Diagnosis not present

## 2021-07-19 ENCOUNTER — Other Ambulatory Visit: Payer: Self-pay

## 2021-07-19 ENCOUNTER — Encounter (HOSPITAL_COMMUNITY): Payer: Self-pay

## 2021-07-19 ENCOUNTER — Ambulatory Visit (HOSPITAL_COMMUNITY)
Admission: EM | Admit: 2021-07-19 | Discharge: 2021-07-19 | Disposition: A | Discharge status: Home / Self Care | Payer: Medicaid Other | Attending: Emergency Medicine | Admitting: Emergency Medicine

## 2021-07-19 DIAGNOSIS — K0889 Other specified disorders of teeth and supporting structures: Secondary | ICD-10-CM | POA: Diagnosis not present

## 2021-07-19 MED ORDER — KETOROLAC TROMETHAMINE 30 MG/ML IJ SOLN
30.0000 mg | Freq: Once | INTRAMUSCULAR | Status: AC
  Administered 2021-07-19: 30 mg via INTRAMUSCULAR

## 2021-07-19 MED ORDER — KETOROLAC TROMETHAMINE 10 MG PO TABS: 10.0000 mg | ORAL_TABLET | Freq: Four times a day (QID) | ORAL | 0 refills | Status: AC | PRN

## 2021-07-19 MED ORDER — AMOXICILLIN 875 MG PO TABS: 875.0000 mg | ORAL_TABLET | Freq: Two times a day (BID) | ORAL | 0 refills | Status: AC

## 2021-07-19 MED ORDER — KETOROLAC TROMETHAMINE 30 MG/ML IJ SOLN
INTRAMUSCULAR | Status: AC
  Filled 2021-07-19: qty 1

## 2021-07-19 NOTE — ED Triage Notes (Signed)
Pt presents to the office for dental pain x 6 months.

## 2021-07-19 NOTE — ED Provider Notes (Signed)
[facility]  ____________________________________________  Time seen: Approximately 6:59 PM  I have reviewed the triage vital signs and the nursing notes.   HISTORY  Chief Complaint Dental Pain   Historian Patient     HPI Haley Griffith is a 50 y.o. female presents to the urgent care with dental pain for the past 6 months.  Patient states that she has an appointment at the end of December but would like to be started on antibiotic until she can be seen.  She would also like something stronger for pain.  Denies pain underneath the tongue or difficulty swallowing.   Past Medical History:  Diagnosis Date   Blind    Hypertension      Immunizations up to date:  Yes.     Past Medical History:  Diagnosis Date   Blind    Hypertension     Patient Active Problem List   Diagnosis Date Noted   JAW PAIN 06/10/2006   FURUNCLE 06/10/2006    Past Surgical History:  Procedure Laterality Date   BREAST SURGERY     MOUTH SURGERY      Prior to Admission medications   Medication Sig Start Date End Date Taking? Authorizing Provider  amoxicillin (AMOXIL) 875 MG tablet Take 1 tablet (875 mg total) by mouth 2 (two) times daily for 10 days. 07/19/21 07/29/21 Yes Pia Mau M, PA-C  ketorolac (TORADOL) 10 MG tablet Take 1 tablet (10 mg total) by mouth every 6 (six) hours as needed for up to 5 days. 07/19/21 07/24/21 Yes Pia Mau M, PA-C  amoxicillin-clavulanate (AUGMENTIN) 875-125 MG tablet Take 1 tablet by mouth every 12 (twelve) hours. 11/01/20   Particia Nearing, PA-C  atropine 1 % ophthalmic solution Place 1 drop into both eyes in the morning and at bedtime. 07/25/20   [provider]  brimonidine (ALPHAGAN) 0.2 % ophthalmic solution Place 1 drop into both eyes 3 times daily. 07/25/20   [provider]  dorzolamide-timolol (COSOPT) 22.3-6.8 MG/ML ophthalmic solution Place 1 drop into both eyes 2 times daily. 07/25/20   [provider]  HYDROcodone-acetaminophen (NORCO) 5-325 MG tablet Take 1 tablet by mouth every 6 (six) hours as needed for moderate pain. Patient not taking: No sig reported 04/30/19   Elvina Sidle, MD  ibuprofen (ADVIL) 200 MG tablet Take 800 mg by mouth every 6 (six) hours as needed for moderate pain.    [provider]  latanoprost (XALATAN) 0.005 % ophthalmic solution Place 1 drop into both eyes nightly. 07/25/20   [provider]  lidocaine (XYLOCAINE) 2 % solution Use as directed 10 mLs in the mouth or throat as needed for mouth pain. 11/01/20   Particia Nearing, PA-C  metFORMIN (GLUCOPHAGE) 500 MG tablet Take by mouth. 07/25/20   [provider]  Nerve Stimulator (PRO COMFORT TENS UNIT) DEVI by Does not apply route.    [provider]  prednisoLONE acetate (PRED FORTE) 1 % ophthalmic suspension Place 1 drop into both eyes 4 times daily. 07/25/20   [provider]  timolol (TIMOPTIC) 0.5 % ophthalmic solution Place 1 drop into the left eye every 12 (twelve) hours. Patient taking differently: Place 1 drop into both eyes every 12 (twelve) hours.  06/25/20   Nira Conn, MD    Allergies Patient has no known allergies.  Family History  Problem Relation Age of Onset   Cancer Mother    Cancer Father     Social History Social History  Tobacco Use   Smoking status: Every Day    Packs/day: 0.50    Types: Cigarettes   Smokeless tobacco: Never  Vaping Use   Vaping Use: Never used  Substance Use Topics   Alcohol use: Yes    Comment: occ   Drug use: Never     Review of Systems  Constitutional: No fever/chills Eyes:  No discharge ENT: Patient has dental pain.  Respiratory: no cough. No SOB/ use of accessory muscles to breath Gastrointestinal:   No nausea, no vomiting.  No diarrhea.  No constipation. Musculoskeletal: Negative for musculoskeletal pain. Skin: Negative for rash, abrasions, lacerations,  ecchymosis.    ____________________________________________   PHYSICAL EXAM:  VITAL SIGNS: ED Triage Vitals  Enc Vitals Group     BP 07/19/21 1821 (!) 150/95     Pulse Rate 07/19/21 1821 82     Resp 07/19/21 1821 16     Temp 07/19/21 1821 98 F (36.7 C)     Temp src --      SpO2 07/19/21 1821 99 %     Weight --      Height --      Head Circumference --      Peak Flow --      Pain Score 07/19/21 1822 9     Pain Loc --      Pain Edu? --      Excl. in GC? --      Constitutional: Alert and oriented. Well appearing and in no acute distress. Eyes: Conjunctivae are normal. PERRL. EOMI. Head: Atraumatic. ENT:      Nose: No congestion/rhinnorhea.      Mouth/Throat: Mucous membranes are moist.  Neck: No stridor.  No cervical spine tenderness to palpation. Cardiovascular: Normal rate, regular rhythm. Normal S1 and S2.  Good peripheral circulation. Respiratory: Normal respiratory effort without tachypnea or retractions. Lungs CTAB. Good air entry to the bases with no decreased or absent breath sounds Gastrointestinal: Bowel sounds x 4 quadrants. Soft and nontender to palpation. No guarding or rigidity. No distention. Musculoskeletal: Full range of motion to all extremities. No obvious deformities noted Neurologic:  Normal for age. No gross focal neurologic deficits are appreciated.  Skin:  Skin is warm, dry and intact. No rash noted. Psychiatric: Mood and affect are normal for age. Speech and behavior are normal.   ____________________________________________   LABS (all labs ordered are listed, but only abnormal results are displayed)  Labs Reviewed - No data to display ____________________________________________  EKG   ____________________________________________  RADIOLOGY   No results found.  ____________________________________________    PROCEDURES  Procedure(s) performed:     Procedures     Medications  ketorolac (TORADOL) 30 MG/ML injection  30 mg (has no administration in time range)     ____________________________________________   INITIAL IMPRESSION / ASSESSMENT AND PLAN / ED COURSE  Pertinent labs & imaging results that were available during my care of the patient were reviewed by me and considered in my medical decision making (see chart for details).      Assessment and plan Dental pain 50 year old female presents to the urgent care with dental pain for the past 6 months.  She was given an injection of Toradol and discharged with oral Toradol.  She was started on amoxicillin.  She was advised to keep appointment with local dentist.     ____________________________________________  FINAL CLINICAL IMPRESSION(S) / ED DIAGNOSES  Final diagnoses:  Pain, dental      NEW MEDICATIONS STARTED DURING THIS  VISIT:  ED Discharge Orders          Ordered    amoxicillin (AMOXIL) 875 MG tablet  2 times daily        07/19/21 1856    ketorolac (TORADOL) 10 MG tablet  Every 6 hours PRN        07/19/21 1856                This chart was dictated using voice recognition software/Dragon. Despite best efforts to proofread, errors can occur which can change the meaning. Any change was purely unintentional.     Orvil Feil, PA-C 07/19/21 1900

## 2021-07-19 NOTE — Discharge Instructions (Signed)
Please keep appointment with local dentist. You can take Toradol up to 4 times daily for the next 5 days.

## 2021-08-10 DIAGNOSIS — Z419 Encounter for procedure for purposes other than remedying health state, unspecified: Secondary | ICD-10-CM | POA: Diagnosis not present

## 2021-09-10 DIAGNOSIS — Z419 Encounter for procedure for purposes other than remedying health state, unspecified: Secondary | ICD-10-CM | POA: Diagnosis not present

## 2021-10-08 DIAGNOSIS — Z419 Encounter for procedure for purposes other than remedying health state, unspecified: Secondary | ICD-10-CM | POA: Diagnosis not present

## 2021-11-08 DIAGNOSIS — Z419 Encounter for procedure for purposes other than remedying health state, unspecified: Secondary | ICD-10-CM | POA: Diagnosis not present

## 2021-12-08 DIAGNOSIS — Z419 Encounter for procedure for purposes other than remedying health state, unspecified: Secondary | ICD-10-CM | POA: Diagnosis not present

## 2022-01-08 DIAGNOSIS — Z419 Encounter for procedure for purposes other than remedying health state, unspecified: Secondary | ICD-10-CM | POA: Diagnosis not present

## 2022-02-07 DIAGNOSIS — Z419 Encounter for procedure for purposes other than remedying health state, unspecified: Secondary | ICD-10-CM | POA: Diagnosis not present

## 2022-03-10 DIAGNOSIS — Z419 Encounter for procedure for purposes other than remedying health state, unspecified: Secondary | ICD-10-CM | POA: Diagnosis not present

## 2022-10-01 IMAGING — DX DG CHEST 1V PORT
1 series · 1 of 1 positions shown · non-contrast
Comparison: Radiograph 03/03/2013

CLINICAL DATA: HMBRO-A8 positive, cough, smoker

EXAM:
PORTABLE CHEST 1 VIEW

[chest ap]
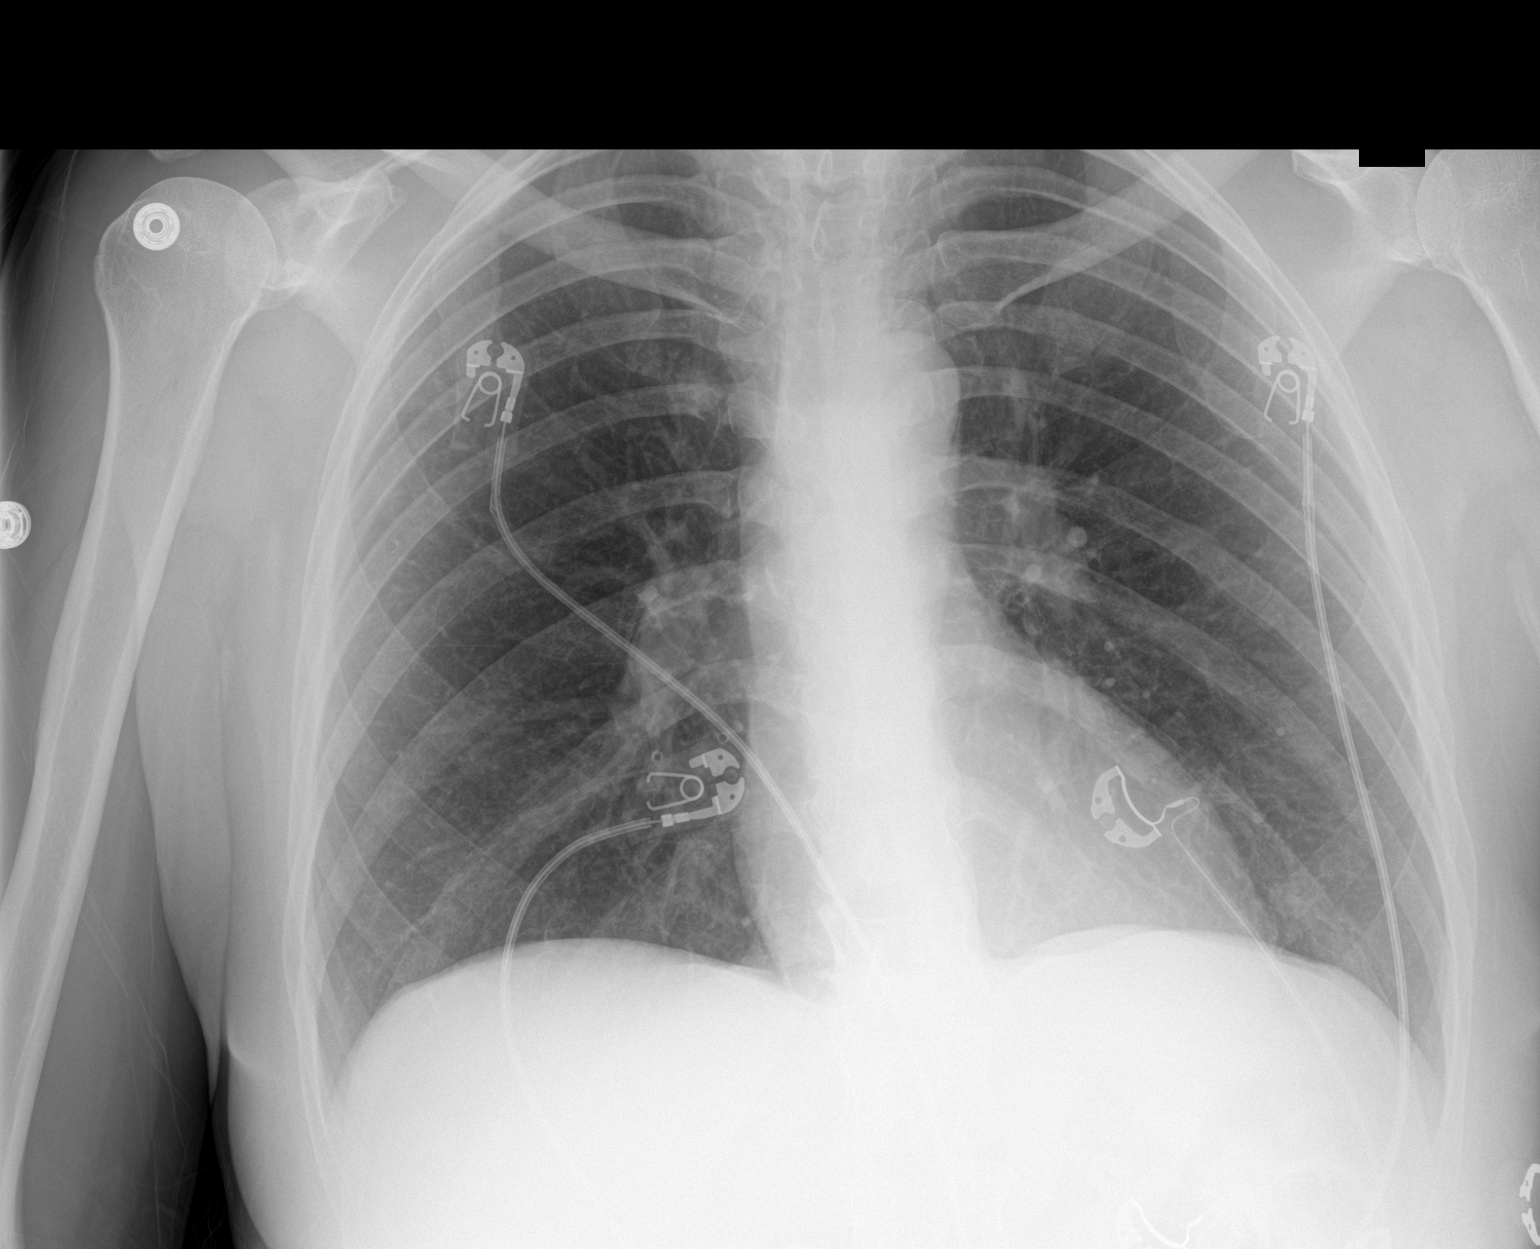

[1 of 1 positions shown; findings below may reference images not displayed]

FINDINGS: No consolidation, features of edema, pneumothorax, or effusion.
Pulmonary vascularity is normally distributed. The cardiomediastinal
contours are unremarkable. No acute osseous or soft tissue
abnormality. Telemetry leads overlie the chest.
IMPRESSION: No acute cardiopulmonary abnormality.

## 2022-10-02 IMAGING — CT CT ANGIO CHEST
1 of 10 series · 17 of 38 positions shown · IV contrast (OMNIPAQUE 350)
Comparison: None.

CLINICAL DATA: Elevated D-dimer.  COVID positive

EXAM:
CT ANGIOGRAPHY CHEST WITH CONTRAST
TECHNIQUE: Multidetector CT imaging of the chest was performed using the
standard protocol during bolus administration of intravenous
contrast. Multiplanar CT image reconstructions and MIPs were
obtained to evaluate the vascular anatomy.
CONTRAST:  100mL OMNIPAQUE IOHEXOL 350 MG/ML SOLN

[Series 5: thins · axial · 0.62mm/px · z∈[+1296,+1529]mm · 17 of 263 slices shown]
[im 15/263  lung]
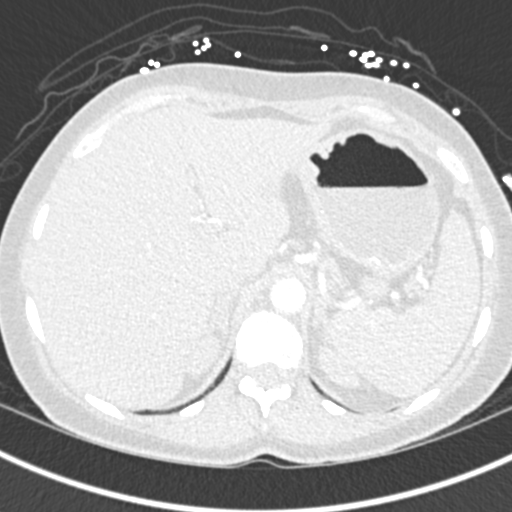
[im 30/263  mediastinal]
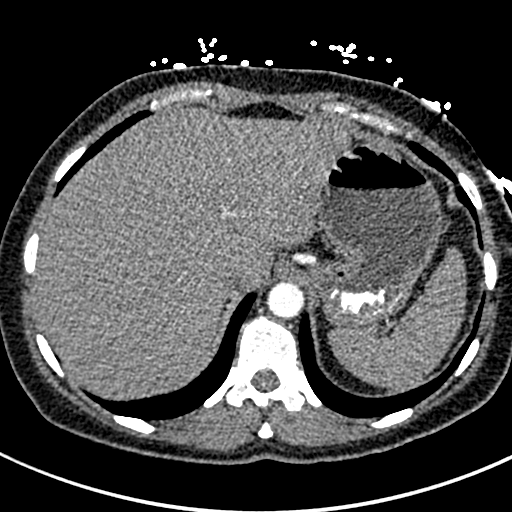
[im 44/263  lung]
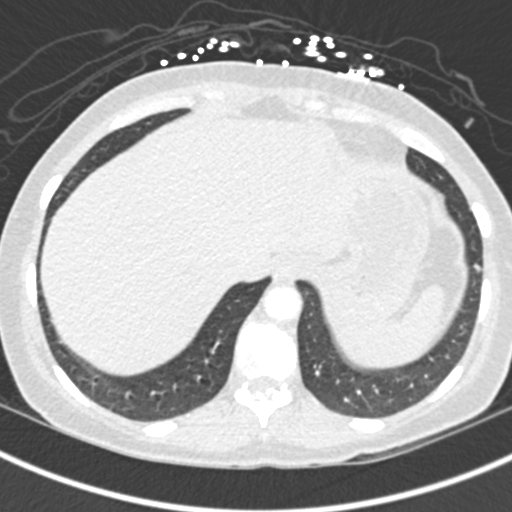
[im 59/263  mediastinal]
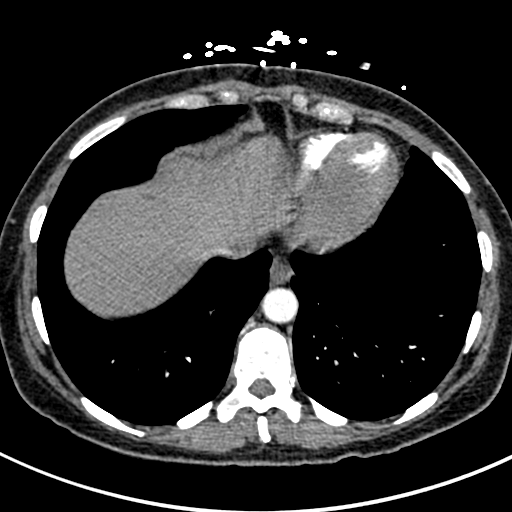
[im 73/263  lung]
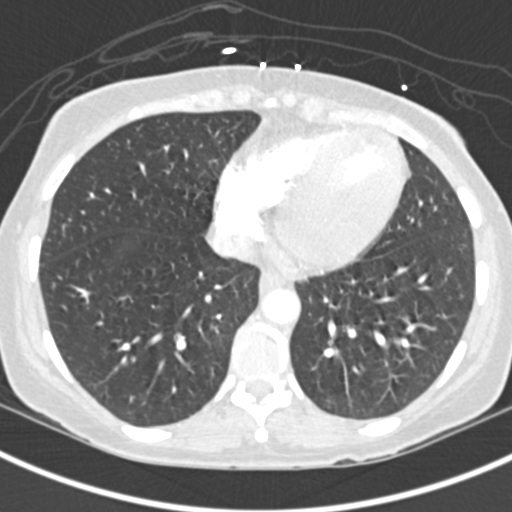
[im 88/263  mediastinal]
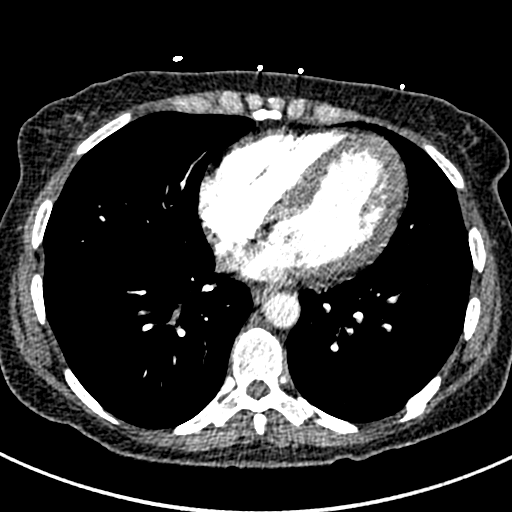
[im 102/263  lung]
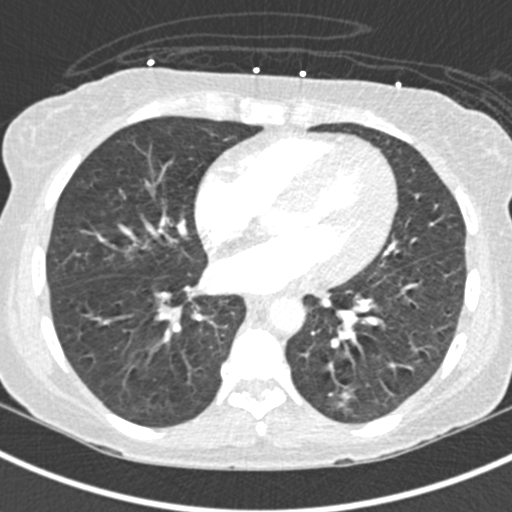
[im 117/263  mediastinal]
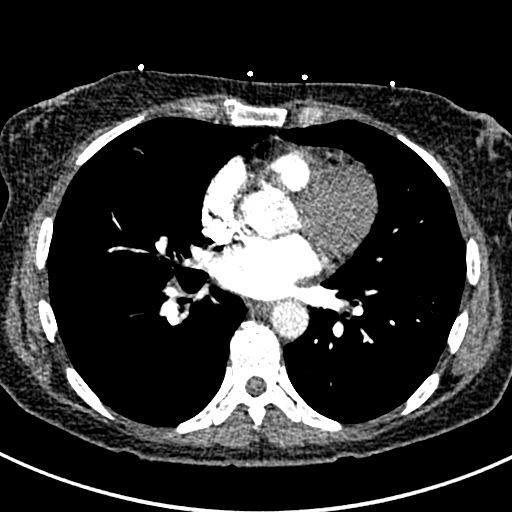
[im 132/263  lung]
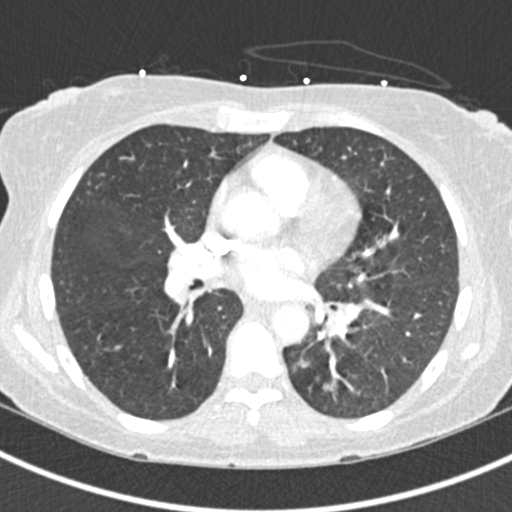
[im 146/263  mediastinal]
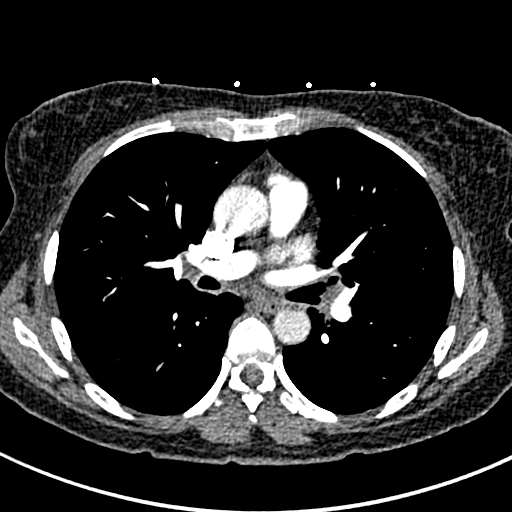
[im 161/263  lung]
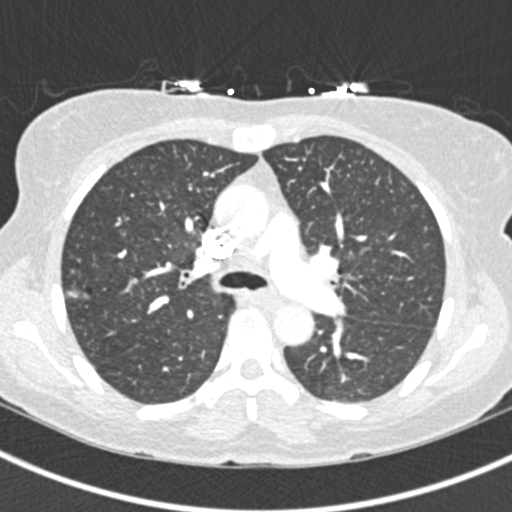
[im 175/263  mediastinal]
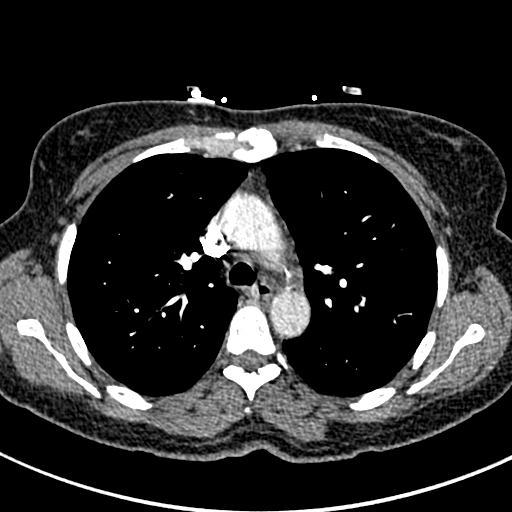
[im 190/263  lung]
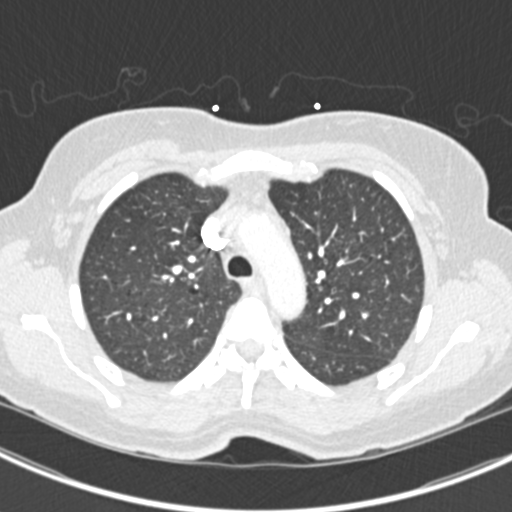
[im 204/263  mediastinal]
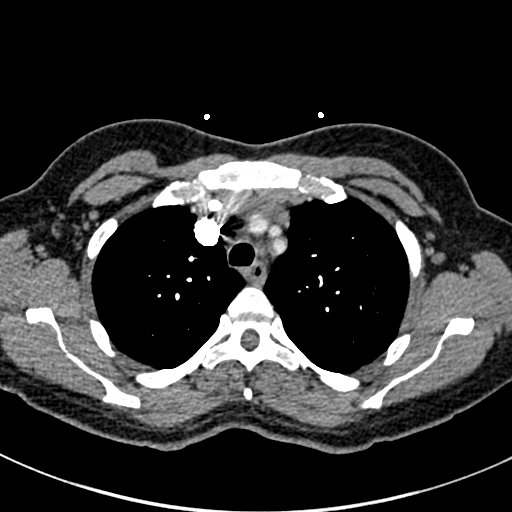
[im 219/263  lung]
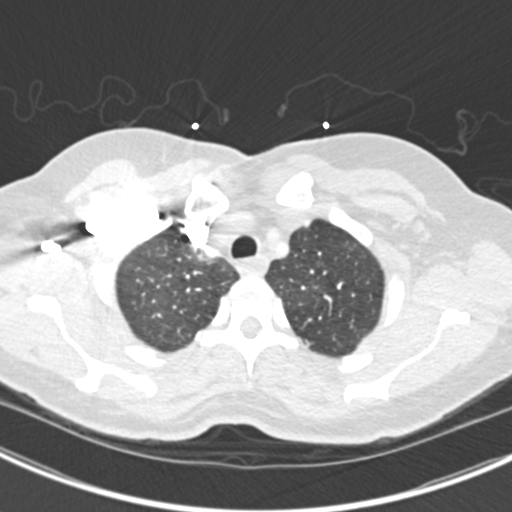
[im 233/263  mediastinal]
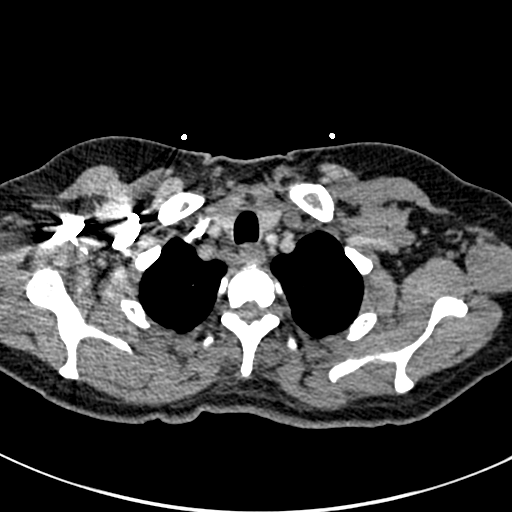
[im 248/263  lung]
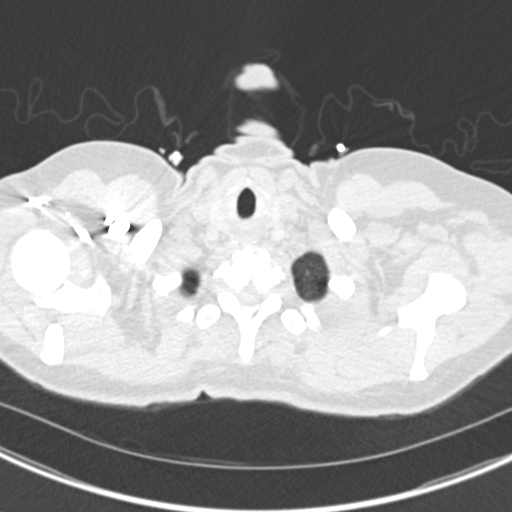

[17 of 38 positions shown; findings below may reference images not displayed]

FINDINGS: Cardiovascular: Satisfactory opacification of the pulmonary arteries
to the segmental level. No evidence of pulmonary embolism when
allowing for levels of streak artifact. Normal heart size. No
pericardial effusion. Atheromatous wall thickening of the aorta and
proximal great vessels, notable for age.

Mediastinum/Nodes: Negative for adenopathy or air leak

Lungs/Pleura: Airway thickening with clusters of indistinct
centrilobular nodules.

1 cm right upper lobe nodule which has some faint internal
high-density that is indeterminate in the setting of is streak
artifact from intravenous contrast in the adjacent SVC.

Upper Abdomen: Negative

Musculoskeletal: Spondylosis.

Review of the MIP images confirms the above findings.
IMPRESSION: 1. Bronchitis with patchy bilateral pneumonia.
2. 1 cm right upper lobe pulmonary nodule. Probable internal
calcification, but certainty is limited by streak artifact from
intravenous contrast. Recommend follow-up noncontrast chest CT in 3
months. This recommendation follows the consensus statement:
Guidelines for Management of Incidental Pulmonary Nodules Detected
3. Negative for pulmonary embolism.

## 2024-08-21 ENCOUNTER — Encounter (HOSPITAL_BASED_OUTPATIENT_CLINIC_OR_DEPARTMENT_OTHER): Payer: Self-pay

## 2024-08-21 ENCOUNTER — Ambulatory Visit (HOSPITAL_BASED_OUTPATIENT_CLINIC_OR_DEPARTMENT_OTHER): Admission: EM | Admit: 2024-08-21 | Discharge: 2024-08-21 | Disposition: A | Discharge status: Home / Self Care

## 2024-08-21 DIAGNOSIS — L0291 Cutaneous abscess, unspecified: Secondary | ICD-10-CM

## 2024-08-21 MED ORDER — HYDROCODONE-ACETAMINOPHEN 5-325 MG PO TABS: 1.0000 | ORAL_TABLET | Freq: Four times a day (QID) | ORAL | 0 refills | Status: AC | PRN

## 2024-08-21 MED ORDER — CEPHALEXIN 500 MG PO CAPS: 500.0000 mg | ORAL_CAPSULE | Freq: Four times a day (QID) | ORAL | 0 refills | Status: AC

## 2024-08-21 NOTE — ED Provider Notes (Signed)
 " [facility]    CSN: [id number] Arrival date & time: 08/21/24  1912      History   Chief Complaint Chief Complaint  Patient presents with   Abscess    HPI Haley Griffith is a 54 y.o. female.   Patient is a 54 year old female that presents today with abscess to right axilla.  History of similar in the past.  States it started to drain yesterday.  States initially started 1 week ago.  No fevers, chills, body aches.    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      Home Medications    Prior to Admission medications  Medication Sig Start Date End Date Taking? Authorizing Provider  amLODipine  (NORVASC ) 10 MG tablet Take 10 mg by mouth daily. 01/07/23  Yes [provider]  budesonide-glycopyrrolate-formoterol (BREZTRI) 160-9-4.8 MCG/ACT AERO inhaler Inhale 2 puffs into the lungs 2 (two) times daily. 08/01/24  Yes [provider]  cephALEXin  (KEFLEX ) 500 MG capsule Take 1 capsule (500 mg total) by mouth 4 (four) times daily. 08/21/24  Yes Geran Haithcock A, FNP  ezetimibe (ZETIA) 10 MG tablet Take 10 mg by mouth daily. 08/01/24  Yes [provider]  omeprazole (PRILOSEC) 20 MG capsule Take 20 mg by mouth 2 (two) times daily before a meal.   Yes [provider]  atorvastatin (LIPITOR) 80 MG tablet Take 80 mg by mouth daily.    [provider]  atropine 1 % ophthalmic solution Place 1 drop into both eyes in the morning and at bedtime. 07/25/20   [provider]  brimonidine  (ALPHAGAN ) 0.2 % ophthalmic solution Place 1 drop into both eyes 3 times daily. 07/25/20   [provider]  dorzolamide -timolol  (COSOPT)  22.3-6.8 MG/ML ophthalmic solution Place 1 drop into both eyes 2 times daily. 07/25/20   [provider]  FARXIGA 10 MG TABS tablet Take 10 mg by mouth daily.    [provider]  FLUoxetine (PROZAC) 20 MG capsule Take 20 mg by mouth daily.    [provider]  HYDROcodone -acetaminophen  (NORCO) 5-325 MG tablet Take 1-2 tablets by mouth every 6 (six) hours as needed for moderate pain (pain score 4-6). 08/21/24   Adah Corning A, FNP  ibuprofen  (ADVIL ) 200 MG tablet Take 800 mg by mouth every 6 (six) hours as needed for moderate pain.    [provider]  latanoprost (XALATAN) 0.005 % ophthalmic solution Place 1 drop into both eyes nightly. 07/25/20   [provider]  lidocaine  (XYLOCAINE ) 2 % solution Use as directed 10 mLs in the mouth or throat as needed for mouth pain. 11/01/20   Stuart Vernell Norris, PA-C  lisinopril  (ZESTRIL ) 20 MG tablet Take 20 mg by mouth daily.    [provider]  metFORMIN (GLUCOPHAGE) 500 MG tablet Take by mouth. 07/25/20   [provider]  Nerve Stimulator (PRO COMFORT TENS UNIT) DEVI by Does not apply route.    [provider]  prednisoLONE acetate (PRED FORTE) 1 % ophthalmic suspension Place 1 drop into both eyes 4 times daily. 07/25/20  [provider]  rOPINIRole (REQUIP) 2 MG tablet Take 2 mg by mouth at bedtime.    [provider]  timolol  (TIMOPTIC ) 0.5 % ophthalmic solution Place 1 drop into the left eye every 12 (twelve) hours. Patient taking differently: Place 1 drop into both eyes every 12 (twelve) hours.  06/25/20   Cardama, Raynell Moder, MD  VRAYLAR 1.5 MG capsule Take 1.5 mg by mouth daily.    [provider]    Family History Family History  Problem Relation Age of Onset   Cancer Mother    Cancer Father     Social History Social History[1]   Allergies   Patient has no known allergies.   Review of Systems Review of Systems See HPI  Physical  Exam Triage Vital Signs ED Triage Vitals [08/21/24 1931]  Encounter Vitals Group     BP 99/66     Girls Systolic BP Percentile      Girls Diastolic BP Percentile      Boys Systolic BP Percentile      Boys Diastolic BP Percentile      Pulse Rate 74     Resp 20     Temp 98.7 F (37.1 C)     Temp Source Oral     SpO2 92 %     Weight      Height      Head Circumference      Peak Flow      Pain Score 8     Pain Loc      Pain Education      Exclude from Growth Chart    No data found.  Updated Vital Signs BP 99/66 (BP Location: Left Arm)   Pulse 74   Temp 98.7 F (37.1 C) (Oral)   Resp 20   SpO2 92%   Visual Acuity Right Eye Distance:   Left Eye Distance:   Bilateral Distance:    Right Eye Near:   Left Eye Near:    Bilateral Near:     Physical Exam Vitals and nursing note reviewed.  Constitutional:      General: Haley Griffith is not in acute distress.    Appearance: Normal appearance. Haley Griffith is not ill-appearing, toxic-appearing or diaphoretic.  Pulmonary:     Effort: Pulmonary effort is normal.  Skin:    General: Skin is warm and dry.     Comments: Abscess to right axilla area currently draining.  Surrounding erythema  Neurological:     Mental Status: Haley Griffith is alert.  Psychiatric:        Mood and Affect: Mood normal.      UC Treatments / Results  Labs (all labs ordered are listed, but only abnormal results are displayed) Labs Reviewed - No data to display  EKG   Radiology No results found.  Procedures Incision and Drainage  Date/Time: 08/22/2024 8:31 AM  Performed by: Adah Wilbert LABOR, FNP Authorized by: Adah Wilbert LABOR, FNP   Consent:    Consent obtained:  Verbal   Consent given by:  Patient   Risks discussed:  Bleeding, incomplete drainage and pain   Alternatives discussed:  No treatment Universal protocol:    Patient identity confirmed:  Verbally with patient Location:    Type:  Abscess   Location:  Upper extremity   Upper extremity location:  Arm    Arm location:  R upper arm Pre-procedure details:    Skin preparation:  Povidone-iodine Sedation:    Sedation type:  None Anesthesia:  Anesthesia method:  Local infiltration   Local anesthetic:  Lidocaine  1% w/o epi Procedure type:    Complexity:  Simple Procedure details:    Ultrasound guidance: no     Needle aspiration: no     Incision types:  Stab incision   Incision depth:  Subcutaneous   Drainage:  Bloody and purulent   Drainage amount:  Scant   Wound treatment:  Wound left open   Packing materials:  None Post-procedure details:    Procedure completion:  Tolerated well, no immediate complications  (including critical care time)  Medications Ordered in UC Medications - No data to display  Initial Impression / Assessment and Plan / UC Course  I have reviewed the triage vital signs and the nursing notes.  Pertinent labs & imaging results that were available during my care of the patient were reviewed by me and considered in my medical decision making (see chart for details).     Abscess-I&D done here in clinic.  Patient tolerated well.  Will place on Keflex  for antibiotic coverage.  Hydrocodone  for severe pain as needed.  Continue warm compresses.  Follow-up as needed Final Clinical Impressions(s) / UC Diagnoses   Final diagnoses:  Abscess   Discharge Instructions   None    ED Prescriptions     Medication Sig Dispense Auth. Provider   HYDROcodone -acetaminophen  (NORCO) 5-325 MG tablet Take 1-2 tablets by mouth every 6 (six) hours as needed for moderate pain (pain score 4-6). 12 tablet Aashika Carta A, FNP   cephALEXin  (KEFLEX ) 500 MG capsule Take 1 capsule (500 mg total) by mouth 4 (four) times daily. 28 capsule Yoshiharu Brassell A, FNP      I have reviewed the PDMP during this encounter.     [1]  Social History Tobacco Use   Smoking status: Former    Current packs/day: 0.50    Types: Cigarettes   Smokeless tobacco: Never  Vaping Use   Vaping status: Never  Used  Substance Use Topics   Alcohol use: Yes    Comment: occ   Drug use: Never     Adah Wilbert LABOR, FNP 08/22/24 [phone]  "

## 2024-08-21 NOTE — ED Triage Notes (Signed)
 Abscess to right armpit. States it started to drain yesterday. Patient believes there is more fluid to come out but needs it opened to drain. Needs antibiotics as well. States initially started 1 week ago.

## 2024-08-22 DIAGNOSIS — L0291 Cutaneous abscess, unspecified: Secondary | ICD-10-CM | POA: Diagnosis not present
